# Patient Record
Sex: Male | Born: 1965 | Race: White | Hispanic: No | Marital: Single | Smoking: Never smoker
Health system: Southern US, Community
[De-identification: ages and names within clinical notes are randomized; demographics above are authoritative.]

## PROBLEM LIST (undated history)

## (undated) DIAGNOSIS — E785 Hyperlipidemia, unspecified: Secondary | ICD-10-CM

## (undated) DIAGNOSIS — G4700 Insomnia, unspecified: Secondary | ICD-10-CM

## (undated) DIAGNOSIS — B009 Herpesviral infection, unspecified: Secondary | ICD-10-CM

## (undated) DIAGNOSIS — R002 Palpitations: Secondary | ICD-10-CM

## (undated) DIAGNOSIS — I10 Essential (primary) hypertension: Secondary | ICD-10-CM

## (undated) DIAGNOSIS — G4730 Sleep apnea, unspecified: Secondary | ICD-10-CM

## (undated) HISTORY — DX: Sleep apnea, unspecified: G47.30

## (undated) HISTORY — DX: Hyperlipidemia, unspecified: E78.5

## (undated) HISTORY — DX: Palpitations: R00.2

## (undated) HISTORY — PX: NOSE SURGERY: SHX723

## (undated) HISTORY — DX: Essential (primary) hypertension: I10

## (undated) HISTORY — DX: Herpesviral infection, unspecified: B00.9

## (undated) HISTORY — DX: Insomnia, unspecified: G47.00

---

## 1997-12-29 ENCOUNTER — Ambulatory Visit: Admission: RE | Admit: 1997-12-29 | Discharge: 1997-12-29 | Payer: Self-pay | Admitting: Pulmonary Disease

## 1998-03-01 ENCOUNTER — Ambulatory Visit (HOSPITAL_BASED_OUTPATIENT_CLINIC_OR_DEPARTMENT_OTHER): Admission: RE | Admit: 1998-03-01 | Discharge: 1998-03-01 | Payer: Self-pay | Admitting: Otolaryngology

## 1998-06-14 ENCOUNTER — Ambulatory Visit: Admission: RE | Admit: 1998-06-14 | Discharge: 1998-06-14 | Payer: Self-pay | Admitting: Otolaryngology

## 1998-08-23 ENCOUNTER — Ambulatory Visit: Admission: RE | Admit: 1998-08-23 | Discharge: 1998-08-23 | Payer: Self-pay | Admitting: Otolaryngology

## 2006-02-05 ENCOUNTER — Ambulatory Visit (HOSPITAL_BASED_OUTPATIENT_CLINIC_OR_DEPARTMENT_OTHER): Admission: RE | Admit: 2006-02-05 | Discharge: 2006-02-05 | Payer: Self-pay | Admitting: Otolaryngology

## 2006-02-14 ENCOUNTER — Ambulatory Visit: Payer: Self-pay | Admitting: Internal Medicine

## 2010-04-14 ENCOUNTER — Encounter
Admission: RE | Admit: 2010-04-14 | Discharge: 2010-04-14 | Payer: Self-pay | Source: Home / Self Care | Attending: Family Medicine | Admitting: Family Medicine

## 2010-08-15 NOTE — Procedures (Signed)
Donald Galloway, MICALE NO.:  [id number]   MEDICAL RECORD NO.:  [id number]          PATIENT TYPE:  OUT   LOCATION:  SLEEP CENTER                 FACILITY:  [facility]   PHYSICIAN:  Clinton D. Maple Hudson, MD, FCCP, FACPDATE OF BIRTH:  April 29, 1964   DATE OF STUDY:  02/05/2006                              NOCTURNAL POLYSOMNOGRAM   INDICATION FOR STUDY:  Hypersomnia with sleep apnea.   EPWORTH SLEEPINESS SCORE:  1/25.   BMI:  25.   WEIGHT:  162 pounds.   HOME MEDICATION:  HCTZ, Zestril, Norvasc.   MEDICATIONS:  Patient has a history of obstructive sleep apnea with unsatisfactory  control.  A diagnostic study in May of 2000 gave an AHI of 24 per hour.  He  has been wearing CPAP, but generally removing it during the night.  He has  had previous nasal surgery.  Re-evaluation with split protocol was  requested.   SLEEP ARCHITECTURE:  Total sleep time 371 minutes with sleep efficiency 88%.  Stage 1 was 4%, stage 2 64%, stages 3 and 4 13%, REM 19% of total sleep  time.  Sleep latency 24 minutes, REM latency 178 minutes, awake after sleep  onset 33 minutes, arousal index 7.4.  No bedtime medication was reported.   RESPIRATORY DATA:  Split study protocol.  Apnea/hypopnea index (AHI, RDI)  27.7 obstructive events per hour, indicating moderate obstructive sleep  apnea/hypopnea syndrome before CPAP.  There were 9 obstructive apneas and 54  hypopneas before CPAP.  All sleep and events were while supine.  REM AHI 0  per hour.  CPAP was titrated to 9 CWP, AHI 0 per hour.  His own medium  ResMed Swift was used with heated humidifier.   OXYGEN DATA:  Moderate to occasionally severe snoring with oxygen  desaturation to a nadir of 85%.  After CPAP control saturation held 96% on  room air.   CARDIAC DATA:  Normal sinus rhythm.   MOVEMENT-PARASOMNIA:  Occasional limb jerk, insignificant.   IMPRESSIONS-RECOMMENDATIONS:  1. Moderate obstructive sleep apnea/hypopnea syndrome,  apnea/hypopnea      index 27.7 per hour, with all sleep and events while supine.  Moderate      to loud snoring with oxygen desaturation to a nadir of 85%.  2. Successful continuous positive airway pressure titration to 9 CWP,      apnea/hypopnea index 0 per hour.  His own [employer] with medium      nasal pillows was used with a heated humidifier.      Clinton D. Maple Hudson, MD, FCCP, FACP  Diplomate, Biomedical engineer of Sleep Medicine  Electronically Signed     CDY/MEDQ  D:  02/14/2006 09:50:16  T:  02/14/2006 13:52:37  Job:  [id number]

## 2012-07-07 ENCOUNTER — Ambulatory Visit
Admission: RE | Admit: 2012-07-07 | Discharge: 2012-07-07 | Disposition: A | Discharge status: Home / Self Care | Payer: 59 | Source: Ambulatory Visit | Attending: Emergency Medicine | Admitting: Emergency Medicine

## 2012-07-07 ENCOUNTER — Other Ambulatory Visit: Payer: Self-pay | Admitting: Emergency Medicine

## 2012-07-07 DIAGNOSIS — R52 Pain, unspecified: Secondary | ICD-10-CM

## 2012-07-19 ENCOUNTER — Other Ambulatory Visit: Payer: Self-pay | Admitting: Emergency Medicine

## 2012-07-19 DIAGNOSIS — G8929 Other chronic pain: Secondary | ICD-10-CM

## 2012-07-22 ENCOUNTER — Ambulatory Visit
Admission: RE | Admit: 2012-07-22 | Discharge: 2012-07-22 | Disposition: A | Discharge status: Home / Self Care | Payer: 59 | Source: Ambulatory Visit | Attending: Emergency Medicine | Admitting: Emergency Medicine

## 2012-07-22 DIAGNOSIS — G8929 Other chronic pain: Secondary | ICD-10-CM

## 2016-04-09 DIAGNOSIS — G4733 Obstructive sleep apnea (adult) (pediatric): Secondary | ICD-10-CM | POA: Diagnosis not present

## 2016-04-13 DIAGNOSIS — K648 Other hemorrhoids: Secondary | ICD-10-CM | POA: Diagnosis not present

## 2016-04-13 DIAGNOSIS — Z1211 Encounter for screening for malignant neoplasm of colon: Secondary | ICD-10-CM | POA: Diagnosis not present

## 2017-01-29 DIAGNOSIS — R7301 Impaired fasting glucose: Secondary | ICD-10-CM | POA: Diagnosis not present

## 2017-01-29 DIAGNOSIS — Z125 Encounter for screening for malignant neoplasm of prostate: Secondary | ICD-10-CM | POA: Diagnosis not present

## 2017-01-29 DIAGNOSIS — I10 Essential (primary) hypertension: Secondary | ICD-10-CM | POA: Diagnosis not present

## 2017-01-29 DIAGNOSIS — E7800 Pure hypercholesterolemia, unspecified: Secondary | ICD-10-CM | POA: Diagnosis not present

## 2017-02-05 DIAGNOSIS — Z125 Encounter for screening for malignant neoplasm of prostate: Secondary | ICD-10-CM | POA: Diagnosis not present

## 2017-02-05 DIAGNOSIS — I10 Essential (primary) hypertension: Secondary | ICD-10-CM | POA: Diagnosis not present

## 2017-02-12 DIAGNOSIS — G4733 Obstructive sleep apnea (adult) (pediatric): Secondary | ICD-10-CM | POA: Diagnosis not present

## 2018-02-02 DIAGNOSIS — E7800 Pure hypercholesterolemia, unspecified: Secondary | ICD-10-CM | POA: Diagnosis not present

## 2018-02-02 DIAGNOSIS — Z0000 Encounter for general adult medical examination without abnormal findings: Secondary | ICD-10-CM | POA: Diagnosis not present

## 2018-02-02 DIAGNOSIS — Z125 Encounter for screening for malignant neoplasm of prostate: Secondary | ICD-10-CM | POA: Diagnosis not present

## 2018-02-09 DIAGNOSIS — Z0000 Encounter for general adult medical examination without abnormal findings: Secondary | ICD-10-CM | POA: Diagnosis not present

## 2020-01-30 ENCOUNTER — Other Ambulatory Visit: Payer: Self-pay | Admitting: Family Medicine

## 2020-01-30 DIAGNOSIS — R1013 Epigastric pain: Secondary | ICD-10-CM

## 2020-02-09 ENCOUNTER — Other Ambulatory Visit: Payer: Self-pay

## 2020-02-09 ENCOUNTER — Ambulatory Visit (INDEPENDENT_AMBULATORY_CARE_PROVIDER_SITE_OTHER): Payer: 59

## 2020-02-09 DIAGNOSIS — R1013 Epigastric pain: Secondary | ICD-10-CM

## 2021-03-02 IMAGING — US US ABDOMEN LIMITED
1 series · 14 of 25 positions shown · non-contrast
Comparison: None

Correlation: CT abdomen and pelvis 04/14/2010

CLINICAL DATA: Constant epigastric pain for several months, history
hypertension, hyperlipidemia

EXAM:
ULTRASOUND ABDOMEN LIMITED RIGHT UPPER QUADRANT

[Series 1: us abdomen limited · 0.14mm/px · 14 of 55 slices shown]
[im 1/55]
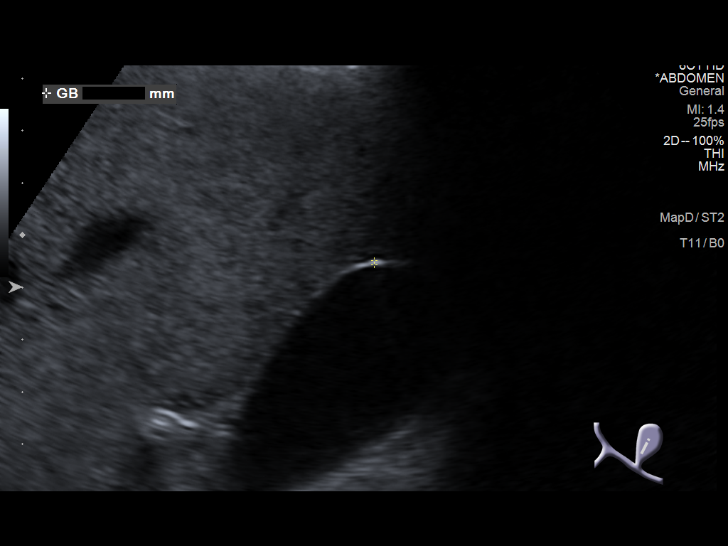
[im 5/55]
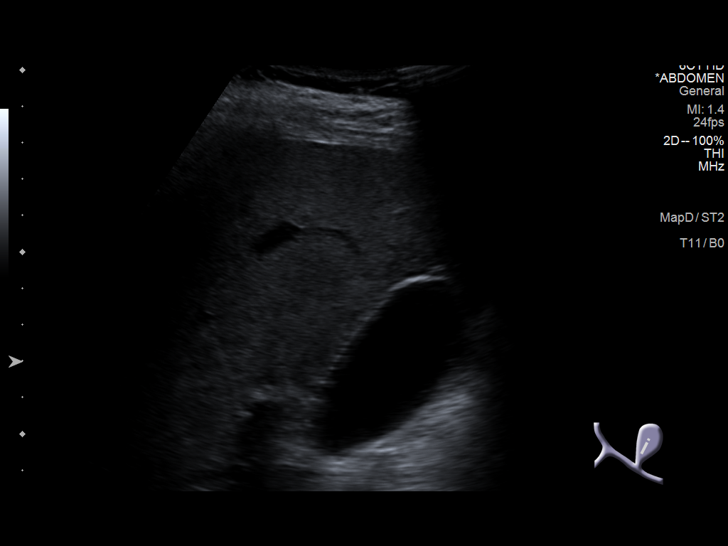
[im 10/55]
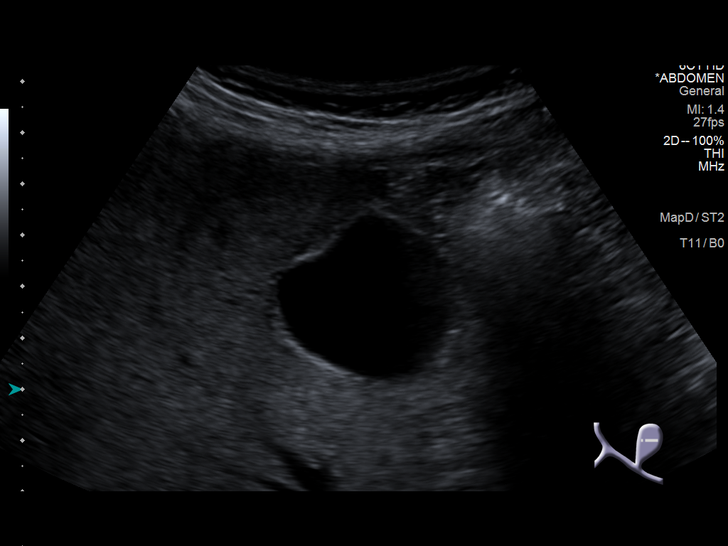
[im 14/55]
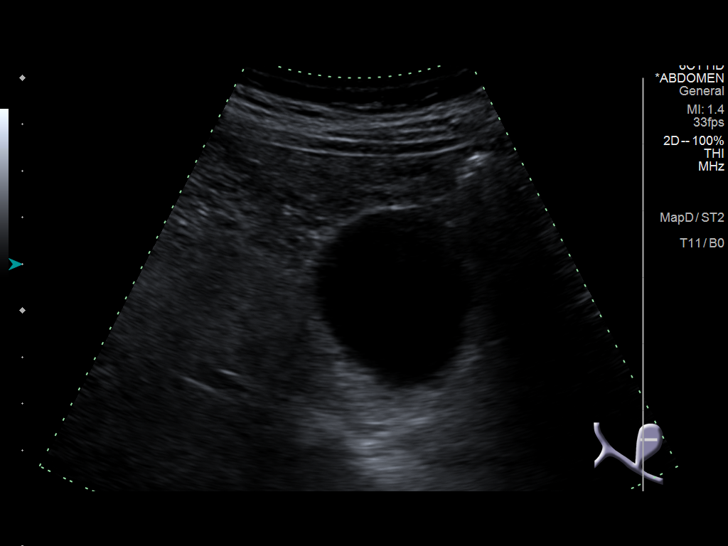
[im 19/55]
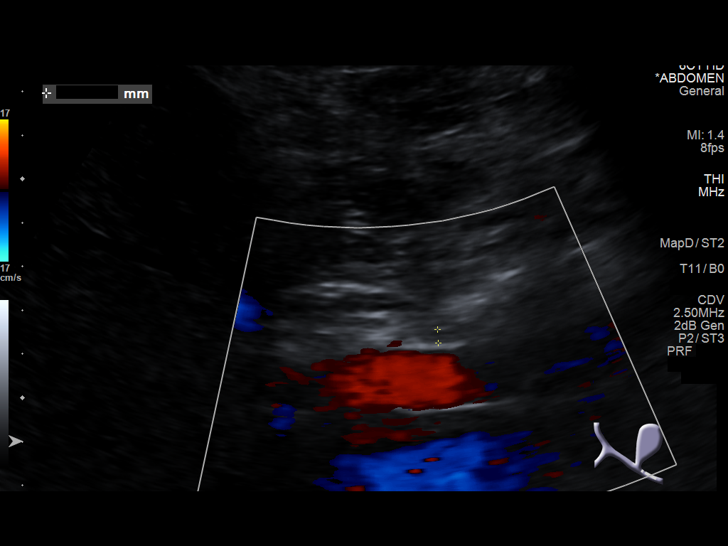
[im 21/55]
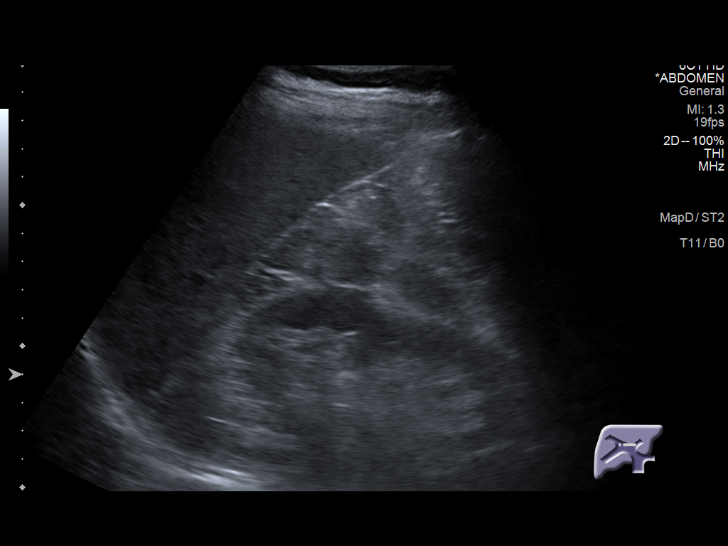
[im 25/55]
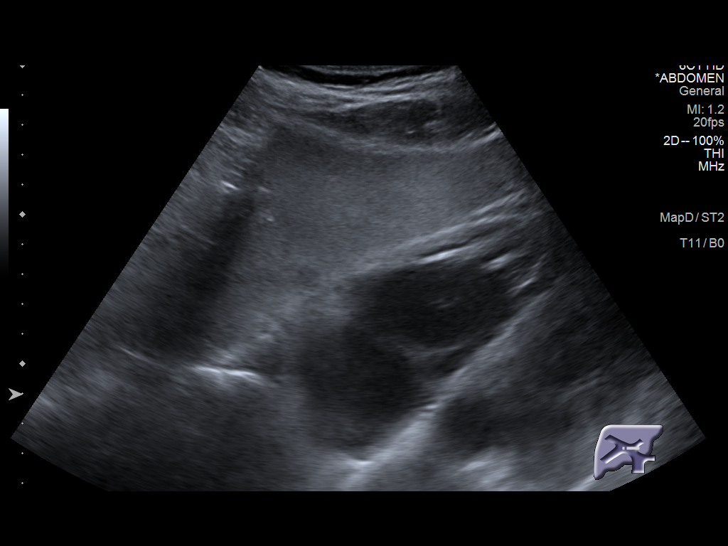
[im 30/55]
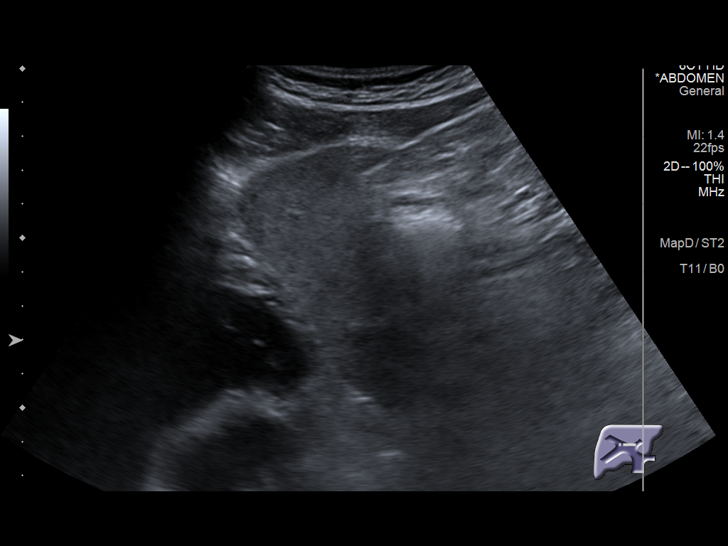
[im 34/55]
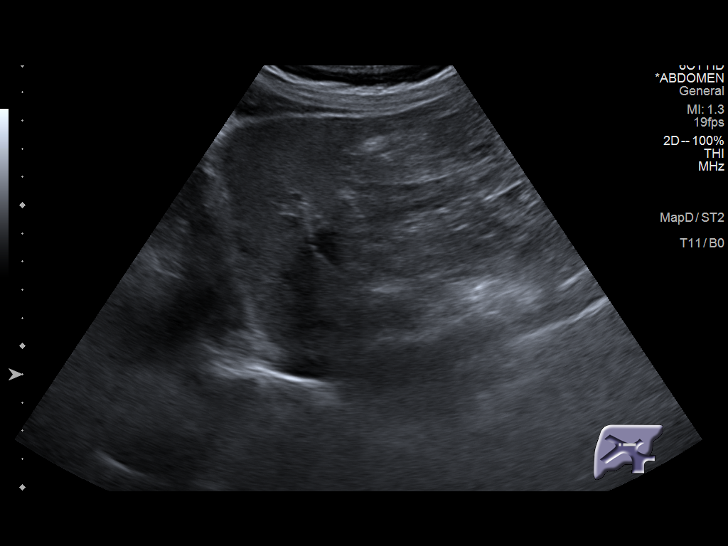
[im 37/55]
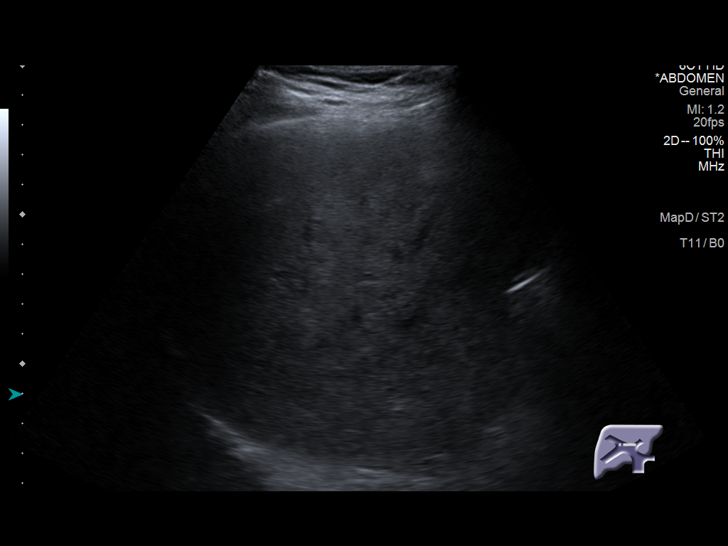
[im 41/55]
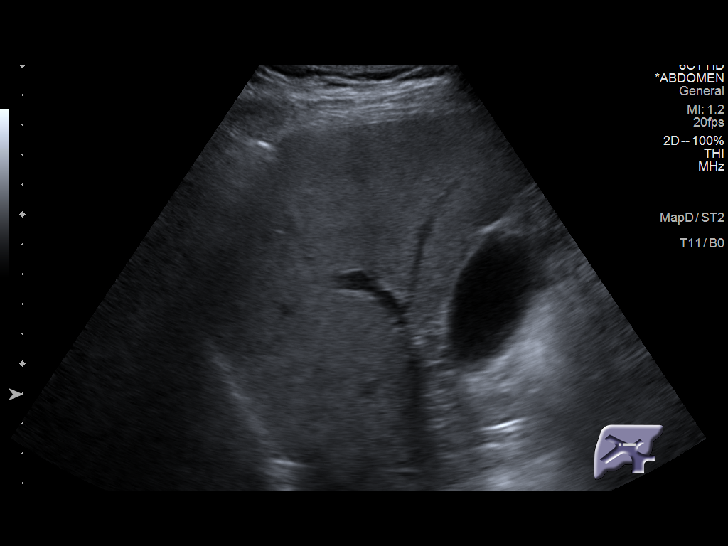
[im 46/55]
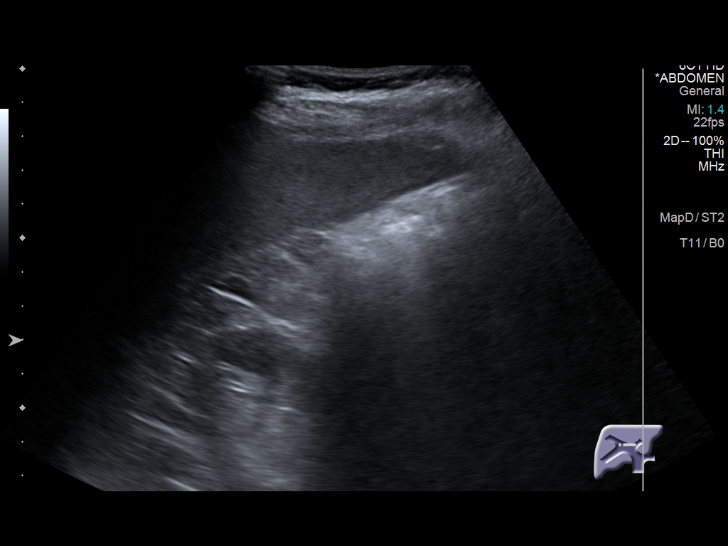
[im 50/55]
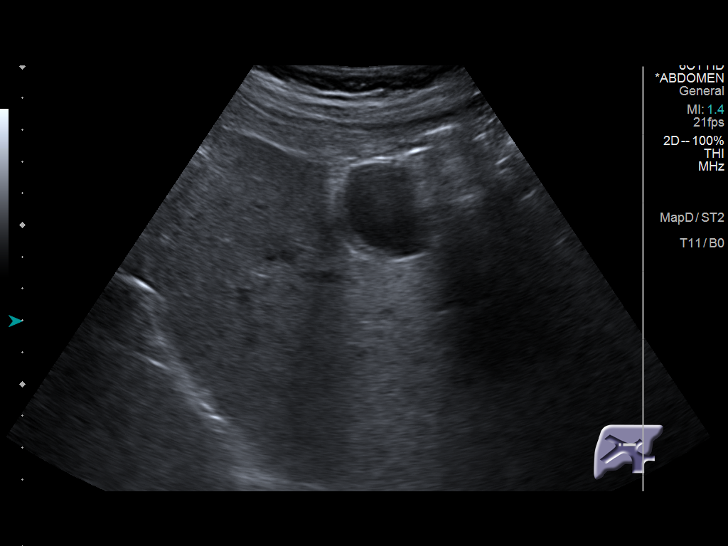
[im 55/55]
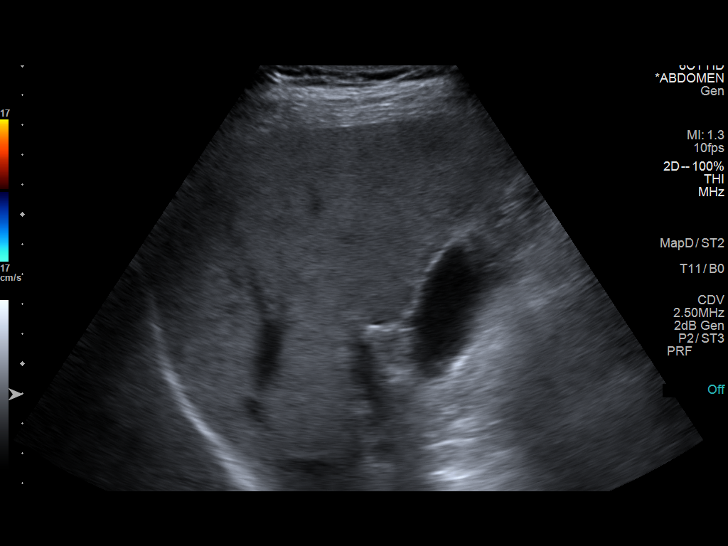

[14 of 25 positions shown; findings below may reference images not displayed]

FINDINGS: Gallbladder:

Normally distended without stones or wall thickening. No
pericholecystic fluid or sonographic Murphy sign.

Common bile duct:

Diameter: Suboptimally visualized due to bowel at the porta hepatis.
No intrahepatic biliary dilatation seen.

Liver:

Echogenic parenchyma, likely fatty infiltration though this can be
seen with cirrhosis and certain infiltrative disorders. No focal
hepatic mass or nodularity. Portal vein is patent on color Doppler
imaging with normal direction of blood flow towards the liver.

Other: No RIGHT upper quadrant free fluid.
IMPRESSION: Probable fatty infiltration of liver as above.

Inadequately visualized CBD; no intrahepatic biliary dilatation
identified.

Otherwise negative exam.

## 2022-07-16 ENCOUNTER — Encounter (HOSPITAL_COMMUNITY): Payer: Self-pay | Admitting: Emergency Medicine

## 2022-07-16 ENCOUNTER — Inpatient Hospital Stay (HOSPITAL_COMMUNITY)
Admission: EM | Admit: 2022-07-16 | Discharge: 2022-07-24 | DRG: 025 | Disposition: A | Discharge status: Home / Self Care | Payer: 59 | Attending: Neurology | Admitting: Neurology

## 2022-07-16 ENCOUNTER — Inpatient Hospital Stay (HOSPITAL_COMMUNITY): Payer: 59

## 2022-07-16 ENCOUNTER — Emergency Department (HOSPITAL_COMMUNITY): Payer: 59

## 2022-07-16 ENCOUNTER — Other Ambulatory Visit: Payer: Self-pay

## 2022-07-16 DIAGNOSIS — I608 Other nontraumatic subarachnoid hemorrhage: Secondary | ICD-10-CM | POA: Diagnosis present

## 2022-07-16 DIAGNOSIS — I959 Hypotension, unspecified: Secondary | ICD-10-CM | POA: Diagnosis not present

## 2022-07-16 DIAGNOSIS — R1013 Epigastric pain: Secondary | ICD-10-CM | POA: Diagnosis not present

## 2022-07-16 DIAGNOSIS — Z79899 Other long term (current) drug therapy: Secondary | ICD-10-CM | POA: Diagnosis not present

## 2022-07-16 DIAGNOSIS — G459 Transient cerebral ischemic attack, unspecified: Secondary | ICD-10-CM | POA: Insufficient documentation

## 2022-07-16 DIAGNOSIS — I636 Cerebral infarction due to cerebral venous thrombosis, nonpyogenic: Secondary | ICD-10-CM | POA: Diagnosis present

## 2022-07-16 DIAGNOSIS — I10 Essential (primary) hypertension: Secondary | ICD-10-CM | POA: Diagnosis present

## 2022-07-16 DIAGNOSIS — G4733 Obstructive sleep apnea (adult) (pediatric): Secondary | ICD-10-CM | POA: Diagnosis present

## 2022-07-16 DIAGNOSIS — Z8249 Family history of ischemic heart disease and other diseases of the circulatory system: Secondary | ICD-10-CM

## 2022-07-16 DIAGNOSIS — E785 Hyperlipidemia, unspecified: Secondary | ICD-10-CM | POA: Diagnosis present

## 2022-07-16 DIAGNOSIS — Q282 Arteriovenous malformation of cerebral vessels: Secondary | ICD-10-CM

## 2022-07-16 DIAGNOSIS — Z83438 Family history of other disorder of lipoprotein metabolism and other lipidemia: Secondary | ICD-10-CM | POA: Diagnosis not present

## 2022-07-16 DIAGNOSIS — R569 Unspecified convulsions: Secondary | ICD-10-CM | POA: Diagnosis not present

## 2022-07-16 DIAGNOSIS — E876 Hypokalemia: Secondary | ICD-10-CM | POA: Diagnosis not present

## 2022-07-16 DIAGNOSIS — I770 Arteriovenous fistula, acquired: Secondary | ICD-10-CM | POA: Diagnosis present

## 2022-07-16 DIAGNOSIS — D6859 Other primary thrombophilia: Secondary | ICD-10-CM | POA: Diagnosis not present

## 2022-07-16 DIAGNOSIS — I609 Nontraumatic subarachnoid hemorrhage, unspecified: Secondary | ICD-10-CM | POA: Diagnosis not present

## 2022-07-16 DIAGNOSIS — R4701 Aphasia: Secondary | ICD-10-CM | POA: Diagnosis present

## 2022-07-16 DIAGNOSIS — G08 Intracranial and intraspinal phlebitis and thrombophlebitis: Secondary | ICD-10-CM | POA: Diagnosis not present

## 2022-07-16 DIAGNOSIS — Z888 Allergy status to other drugs, medicaments and biological substances status: Secondary | ICD-10-CM | POA: Diagnosis not present

## 2022-07-16 DIAGNOSIS — I676 Nonpyogenic thrombosis of intracranial venous system: Principal | ICD-10-CM | POA: Diagnosis present

## 2022-07-16 DIAGNOSIS — R4182 Altered mental status, unspecified: Secondary | ICD-10-CM | POA: Diagnosis present

## 2022-07-16 DIAGNOSIS — I671 Cerebral aneurysm, nonruptured: Secondary | ICD-10-CM

## 2022-07-16 LAB — CBC
HCT: 43.4 % (ref 39.0–52.0)
Hemoglobin: 15.2 g/dL (ref 13.0–17.0)
MCH: 30.2 pg (ref 26.0–34.0)
MCHC: 35 g/dL (ref 30.0–36.0)
MCV: 86.1 fL (ref 80.0–100.0)
Platelets: 281 10*3/uL (ref 150–400)
RBC: 5.04 MIL/uL (ref 4.22–5.81)
RDW: 11.6 % (ref 11.5–15.5)
WBC: 5.9 10*3/uL (ref 4.0–10.5)
nRBC: 0 % (ref 0.0–0.2)

## 2022-07-16 LAB — COMPREHENSIVE METABOLIC PANEL
ALT: 37 U/L (ref 0–44)
AST: 26 U/L (ref 15–41)
Albumin: 4.3 g/dL (ref 3.5–5.0)
Alkaline Phosphatase: 48 U/L (ref 38–126)
Anion gap: 10 (ref 5–15)
BUN: 15 mg/dL (ref 6–20)
CO2: 26 mmol/L (ref 22–32)
Calcium: 9.6 mg/dL (ref 8.9–10.3)
Chloride: 101 mmol/L (ref 98–111)
Creatinine, Ser: 1.07 mg/dL (ref 0.61–1.24)
GFR, Estimated: >60 mL/min (ref >60)
Glucose, Bld: 108 mg/dL — ABNORMAL HIGH (ref 70–99)
Potassium: 4 mmol/L (ref 3.5–5.1)
Sodium: 137 mmol/L (ref 135–145)
Total Bilirubin: 1 mg/dL (ref 0.3–1.2)
Total Protein: 7.1 g/dL (ref 6.5–8.1)

## 2022-07-16 LAB — I-STAT CHEM 8, ED
BUN: 18 mg/dL (ref 6–20)
Calcium, Ion: 1.21 mmol/L (ref 1.15–1.40)
Chloride: 101 mmol/L (ref 98–111)
Creatinine, Ser: 1 mg/dL (ref 0.61–1.24)
Glucose, Bld: 105 mg/dL — ABNORMAL HIGH (ref 70–99)
HCT: 44 % (ref 39.0–52.0)
Hemoglobin: 15 g/dL (ref 13.0–17.0)
Potassium: 4.1 mmol/L (ref 3.5–5.1)
Sodium: 138 mmol/L (ref 135–145)
TCO2: 26 mmol/L (ref 22–32)

## 2022-07-16 LAB — HEMOGLOBIN A1C
Hgb A1c MFr Bld: 5.2 % (ref 4.8–5.6)
Mean Plasma Glucose: 102.54 mg/dL

## 2022-07-16 LAB — ETHANOL: Alcohol, Ethyl (B): <10 mg/dL (ref <10)

## 2022-07-16 LAB — DIFFERENTIAL
Abs Immature Granulocytes: 0.02 10*3/uL (ref 0.00–0.07)
Basophils Absolute: 0 10*3/uL (ref 0.0–0.1)
Basophils Relative: 1 %
Eosinophils Absolute: 0.1 10*3/uL (ref 0.0–0.5)
Eosinophils Relative: 2 %
Immature Granulocytes: 0 %
Lymphocytes Relative: 30 %
Lymphs Abs: 1.8 10*3/uL (ref 0.7–4.0)
Monocytes Absolute: 0.5 10*3/uL (ref 0.1–1.0)
Monocytes Relative: 8 %
Neutro Abs: 3.4 10*3/uL (ref 1.7–7.7)
Neutrophils Relative %: 59 %

## 2022-07-16 LAB — HEPARIN LEVEL (UNFRACTIONATED): Heparin Unfractionated: 0.24 IU/mL — ABNORMAL LOW (ref 0.30–0.70)

## 2022-07-16 LAB — HIV ANTIBODY (ROUTINE TESTING W REFLEX): HIV Screen 4th Generation wRfx: NONREACTIVE

## 2022-07-16 LAB — PROTIME-INR
INR: 1 (ref 0.8–1.2)
Prothrombin Time: 12.9 seconds (ref 11.4–15.2)

## 2022-07-16 LAB — APTT: aPTT: 27 seconds (ref 24–36)

## 2022-07-16 MED ORDER — IOHEXOL 350 MG/ML SOLN
75.0000 mL | Freq: Once | INTRAVENOUS | Status: AC | PRN
  Administered 2022-07-16: 75 mL via INTRAVENOUS

## 2022-07-16 MED ORDER — ROSUVASTATIN CALCIUM 5 MG PO TABS
5.0000 mg | ORAL_TABLET | Freq: Every day | ORAL | Status: DC
  Administered 2022-07-17: 5 mg via ORAL
  Filled 2022-07-16: qty 1

## 2022-07-16 MED ORDER — TRAZODONE HCL 50 MG PO TABS
50.0000 mg | ORAL_TABLET | Freq: Every day | ORAL | Status: DC
  Administered 2022-07-16: 25 mg via ORAL
  Administered 2022-07-17 – 2022-07-21 (×3): 50 mg via ORAL
  Filled 2022-07-16 (×6): qty 1

## 2022-07-16 MED ORDER — ACETAMINOPHEN 325 MG PO TABS
650.0000 mg | ORAL_TABLET | ORAL | Status: DC | PRN
  Administered 2022-07-17: 650 mg via ORAL
  Filled 2022-07-16 (×2): qty 2

## 2022-07-16 MED ORDER — LORATADINE 10 MG PO TABS: 10.0000 mg | ORAL_TABLET | Freq: Every day | ORAL | Status: DC | PRN

## 2022-07-16 MED ORDER — HEPARIN (PORCINE) 25000 UT/250ML-% IV SOLN
1000.0000 [IU]/h | INTRAVENOUS | Status: DC
  Administered 2022-07-17: 1100 [IU]/h via INTRAVENOUS
  Administered 2022-07-18 – 2022-07-20 (×3): 1000 [IU]/h via INTRAVENOUS
  Filled 2022-07-16 (×3): qty 250

## 2022-07-16 MED ORDER — ACETAMINOPHEN 160 MG/5ML PO SOLN: 650.0000 mg | ORAL | Status: DC | PRN

## 2022-07-16 MED ORDER — LISINOPRIL 20 MG PO TABS
20.0000 mg | ORAL_TABLET | Freq: Every day | ORAL | Status: DC
  Administered 2022-07-17 – 2022-07-22 (×6): 20 mg via ORAL
  Filled 2022-07-16 (×6): qty 1

## 2022-07-16 MED ORDER — STROKE: EARLY STAGES OF RECOVERY BOOK
Freq: Once | Status: AC
  Filled 2022-07-16: qty 1

## 2022-07-16 MED ORDER — AMLODIPINE BESYLATE 5 MG PO TABS
5.0000 mg | ORAL_TABLET | Freq: Every day | ORAL | Status: DC
  Administered 2022-07-16 – 2022-07-22 (×7): 5 mg via ORAL
  Filled 2022-07-16 (×7): qty 1

## 2022-07-16 MED ORDER — HYDRALAZINE HCL 20 MG/ML IJ SOLN: 5.0000 mg | Freq: Four times a day (QID) | INTRAMUSCULAR | Status: DC | PRN

## 2022-07-16 MED ORDER — SODIUM CHLORIDE 0.9% FLUSH: 3.0000 mL | Freq: Once | INTRAVENOUS | Status: DC

## 2022-07-16 MED ORDER — HEPARIN (PORCINE) 25000 UT/250ML-% IV SOLN
1100.0000 [IU]/h | INTRAVENOUS | Status: DC
  Administered 2022-07-16: 1100 [IU]/h via INTRAVENOUS
  Filled 2022-07-16: qty 250

## 2022-07-16 MED ORDER — ACETAMINOPHEN 650 MG RE SUPP: 650.0000 mg | RECTAL | Status: DC | PRN

## 2022-07-16 NOTE — Plan of Care (Signed)
  Problem: Education: Goal: Knowledge of disease or condition will improve Outcome: Progressing Goal: Knowledge of secondary prevention will improve (MUST DOCUMENT ALL) Outcome: Progressing Goal: Knowledge of patient specific risk factors will improve (Mark N/A or DELETE if not current risk factor) Outcome: Progressing   Problem: Ischemic Stroke/TIA Tissue Perfusion: Goal: Complications of ischemic stroke/TIA will be minimized Outcome: Progressing   Problem: Coping: Goal: Will verbalize positive feelings about self Outcome: Progressing Goal: Will identify appropriate support needs Outcome: Progressing   

## 2022-07-16 NOTE — H&P (Signed)
History and Physical    Donald Galloway ZOX:[id number] DOB: Apr 23, 1965 DOA: 07/16/2022  PCP: Pcp, No (Confirm with patient/family/NH records and if not entered, this has to be entered at [facility] point of entry) Patient coming from: Home  I have personally briefly reviewed patient's old medical records in [facility]  Chief Complaint: Slurred and confusion.  HPI: Donald Galloway is a 56 y.o. male with medical history significant of HTN, HLD, seasonal allergies, presented with transient slurred speech and confusion  Yesterday, patient woke up with mild headache on the right forehead for which he took some OTC Tylenol and headache went away.  Symptom happened yesterday afternoon while at work, patient developed sudden onset of confusion and slurred speech.  At the moment he had trouble to understand his colleagues words and meantime he also had trouble to talk himself.  Symptoms lasted about 2 to 3 hours and resolved.  Denies any weakness Of his limbs, no double vision no hearing problems. ED Course: None bradycardia none hypotensive.  CT head showed hyperdense appearance of the left sigmoid/transverse sinus junction concerning for dural venous sinus thrombosis.  CTA showed filling defect in the left transverse sinus/sigmoid sinus and left jugular bulb concerning for thrombosis.  MRI of the brain showed abnormal signals in the left transverse and sigmoid sinus and left jugular bulb compatible with dural venous sinus thrombosis and small focus of circular FLAIR hyperintensity compatible with small volume acute SAH and recommend MRA to rule out dural AV fistula or AVM formation.  Review of Systems: As per HPI otherwise 14 point review of systems negative.    Past Medical History:  Diagnosis Date   Herpes simplex    Hyperlipidemia    Hypertension    Insomnia    Palpitations    evaluated by DR Eldridge Dace   Sleep apnea     Past Surgical History:  Procedure Laterality Date   NOSE SURGERY     DR  SHOEMAKER     reports that he has never smoked. He does not have any smokeless tobacco history on file. He reports current alcohol use. He reports that he does not use drugs.  Allergies  Allergen Reactions   Zolpidem Tartrate     Leaves him groggy in the AM    Family History  Problem Relation Age of Onset   Hyperlipidemia Mother    Hyperlipidemia Father    Hypertension Father      Prior to Admission medications   Medication Sig Start Date End Date Taking? Authorizing Provider  lisinopril (PRINIVIL,ZESTRIL) 40 MG tablet Take 20 mg by mouth daily. Pt stated he takes 1/2 tablet everyday since a year ago.   Yes [provider]  loratadine (CLARITIN) 10 MG tablet Take 10 mg by mouth daily as needed for allergies.   Yes [provider]  rosuvastatin (CRESTOR) 5 MG tablet Take 5 mg by mouth daily.   Yes [provider]  traZODone (DESYREL) 100 MG tablet Take 50 mg by mouth at bedtime. Pt stated he took 1/2 tablet the night of 07/15/22   Yes [provider]  amLODipine (NORVASC) 10 MG tablet Take 10 mg by mouth daily. Patient not taking: Reported on 07/16/2022    [provider]  aspirin 81 MG tablet Take 81 mg by mouth daily. Patient not taking: Reported on 07/16/2022    [provider]  valACYclovir (VALTREX) 1000 MG tablet Take 1,000 mg by mouth 2 (two) times daily. Patient not taking: Reported on  07/16/2022    [provider]    Physical Exam: Vitals:   07/16/22 1149 07/16/22 1200 07/16/22 1245 07/16/22 1600  BP: (!) 142/82 131/81 129/83 (!) 160/90  Pulse: 93 89 91 94  Resp: 15 16 20 18   Temp: 98.7 F (37.1 C) 98 F (36.7 C)  98.1 F (36.7 C)  TempSrc: Oral Oral    SpO2: 100% 100% 100% 100%  Weight:      Height:        Constitutional: NAD, calm, comfortable Vitals:   07/16/22 1149 07/16/22 1200 07/16/22 1245 07/16/22 1600  BP: (!) 142/82 131/81 129/83 (!) 160/90  Pulse: 93 89 91 94  Resp: 15 16 20 18   Temp:  98.7 F (37.1 C) 98 F (36.7 C)  98.1 F (36.7 C)  TempSrc: Oral Oral    SpO2: 100% 100% 100% 100%  Weight:      Height:       Eyes: PERRL, lids and conjunctivae normal ENMT: Mucous membranes are moist. Posterior pharynx clear of any exudate or lesions.Normal dentition.  Neck: normal, supple, no masses, no thyromegaly Respiratory: clear to auscultation bilaterally, no wheezing, no crackles. Normal respiratory effort. No accessory muscle use.  Cardiovascular: Regular rate and rhythm, no murmurs / rubs / gallops. No extremity edema. 2+ pedal pulses. No carotid bruits.  Abdomen: no tenderness, no masses palpated. No hepatosplenomegaly. Bowel sounds positive.  Musculoskeletal: no clubbing / cyanosis. No joint deformity upper and lower extremities. Good ROM, no contractures. Normal muscle tone.  Skin: no rashes, lesions, ulcers. No induration Neurologic: CN 2-12 grossly intact. Sensation intact, DTR normal. Strength 5/5 in all 4.  Psychiatric: Normal judgment and insight. Alert and oriented x 3. Normal mood.     Labs on Admission: I have personally reviewed following labs and imaging studies  CBC: Recent Labs  Lab 07/16/22 1017 07/16/22 1034  WBC 5.9  --   NEUTROABS 3.4  --   HGB 15.2 15.0  HCT 43.4 44.0  MCV 86.1  --   PLT 281  --    Basic Metabolic Panel: Recent Labs  Lab 07/16/22 1017 07/16/22 1034  NA 137 138  K 4.0 4.1  CL 101 101  CO2 26  --   GLUCOSE 108* 105*  BUN 15 18  CREATININE 1.07 1.00  CALCIUM 9.6  --    GFR: Estimated Creatinine Clearance: 82.9 mL/min (by C-G formula based on SCr of 1 mg/dL). Liver Function Tests: Recent Labs  Lab 07/16/22 1017  AST 26  ALT 37  ALKPHOS 48  BILITOT 1.0  PROT 7.1  ALBUMIN 4.3   No results for input(s): "LIPASE", "AMYLASE" in the last 168 hours. No results for input(s): "AMMONIA" in the last 168 hours. Coagulation Profile: Recent Labs  Lab 07/16/22 1017  INR 1.0   Cardiac Enzymes: No results for  input(s): "CKTOTAL", "CKMB", "CKMBINDEX", "TROPONINI" in the last 168 hours. BNP (last 3 results) No results for input(s): "PROBNP" in the last 8760 hours. HbA1C: No results for input(s): "HGBA1C" in the last 72 hours. CBG: No results for input(s): "GLUCAP" in the last 168 hours. Lipid Profile: No results for input(s): "CHOL", "HDL", "LDLCALC", "TRIG", "CHOLHDL", "LDLDIRECT" in the last 72 hours. Thyroid Function Tests: No results for input(s): "TSH", "T4TOTAL", "FREET4", "T3FREE", "THYROIDAB" in the last 72 hours. Anemia Panel: No results for input(s): "VITAMINB12", "FOLATE", "FERRITIN", "TIBC", "IRON", "RETICCTPCT" in the last 72 hours. Urine analysis: No results found for: "COLORURINE", "APPEARANCEUR", "LABSPEC", "PHURINE", "GLUCOSEU", "HGBUR", "BILIRUBINUR", "KETONESUR", "PROTEINUR", "UROBILINOGEN", "  NITRITE", "LEUKOCYTESUR"  Radiological Exams on Admission: MR BRAIN WO CONTRAST  Result Date: 07/16/2022 CLINICAL DATA:  Transient ischemic attack (TIA) EXAM: MRI HEAD WITHOUT CONTRAST TECHNIQUE: Multiplanar, multiecho pulse sequences of the brain and surrounding structures were obtained without intravenous contrast. COMPARISON:  None Available. FINDINGS: Brain: No acute infarction, hydrocephalus, extra-axial collection or mass lesion. Small focus of sulcal FLAIR hyperintensity in the posterior aspect of the left sylvian fissure is compatible with small volume acute subarachnoid hemorrhage. On susceptibility weighted imaging prominent serpiginous vessels in the left cerebellum, left parieto-occipital and posterior temporal regions. Vascular: Abnormal signal in the left transverse and sigmoid sinuses and left jugular bulb. Major arterial flow voids are maintained at the skull base. Skull and upper cervical spine: Normal marrow signal. Sinuses/Orbits: Negative. IMPRESSION: 1. Abnormal signal in the left transverse and sigmoid sinuses and left jugular bulb, compatible with dural venous sinus  thrombosis seen on same day CT venogram. No evidence of adjacent acute infarct. 2. Small focus of sulcal FLAIR hyperintensity in the posterior aspect of the left sylvian fissure is compatible with small volume acute subarachnoid hemorrhage. 3. On susceptibility weighted imaging prominent serpiginous vessels in the left cerebellum, left parieto-occipital and posterior temporal regions (adjacent to the thrombosed sinuses). This probably represents venous congestion due to the dural venous thrombosis described above, but MRA is recommended to exclude dural AV fistula or AVM as a cause of the patient's sinus thrombosis. Electronically Signed   By: Feliberto Harts M.D.   On: 07/16/2022 16:09   CT VENOGRAM HEAD  Result Date: 07/16/2022 CLINICAL DATA:  Dural venous sinus thrombosis suspected EXAM: CT VENOGRAM HEAD TECHNIQUE: Venographic phase images of the brain were obtained following the administration of intravenous contrast. Multiplanar reformats and maximum intensity projections were generated. RADIATION DOSE REDUCTION: This exam was performed according to the departmental dose-optimization program which includes automated exposure control, adjustment of the mA and/or kV according to patient size and/or use of iterative reconstruction technique. CONTRAST:  75mL OMNIPAQUE IOHEXOL 350 MG/ML SOLN COMPARISON:  No prior CT venogram available, correlation is made with CT head 07/16/2022 10:40 a.m. FINDINGS: Filling defect in the left transverse sinus (series 3, images 12-13 and series 7, images 169-22), left sigmoid sinus (series 7, images 137-151 and series 3, images 5-9), and in the jugular bulb (series 3, images 2-4 and series 7, images 124-130). Otherwise patent venous sinuses. IMPRESSION: Filling defect in the left transverse sinus, left sigmoid sinus, and left jugular bulb, concerning for thrombosis. These results were called by telephone at the time of interpretation on 07/16/2022 at 2:22 pm to provider [facility]  RAY , who verbally acknowledged these results. Electronically Signed   By: Wiliam Ke M.D.   On: 07/16/2022 14:24   CT HEAD WO CONTRAST  Result Date: 07/16/2022 CLINICAL DATA:  Altered mental status EXAM: CT HEAD WITHOUT CONTRAST TECHNIQUE: Contiguous axial images were obtained from the base of the skull through the vertex without intravenous contrast. RADIATION DOSE REDUCTION: This exam was performed according to the departmental dose-optimization program which includes automated exposure control, adjustment of the mA and/or kV according to patient size and/or use of iterative reconstruction technique. COMPARISON:  None Available. FINDINGS: Brain: No evidence of acute infarction, hydrocephalus, or mass lesion/mass effect. Linear hyperdensity in the left middle cranial fossa is nonspecific, but could represent a small amount of subarachnoid blood products (series 3, image 10). Vascular: Asymmetrically hyperdense appearance of the left sigmoid/transverse sinus junction (series 3, image 13). Skull: Normal. Negative for fracture or  focal lesion. Sinuses/Orbits: No middle ear or mastoid effusion. Frothy secretions in the right sphenoid sinus, which can be seen in the setting of acute sinusitis. Orbits are unremarkable. Other: None. IMPRESSION: 1. Asymmetrically hyperdense appearance of the left sigmoid/transverse sinus junction, concerning for dural venous sinus thrombosis. Recommend further evaluation with a CT venogram. 2. Linear hyperdensity in the left middle cranial fossa is nonspecific, but could represent a small amount of subarachnoid blood products. 3. Frothy secretions in the right sphenoid sinus, which can be seen in the setting of acute sinusitis. Electronically Signed   By: Lorenza Cambridge M.D.   On: 07/16/2022 11:13    EKG: Independently reviewed.  Sinus rhythm, no acute ST changes.  Assessment/Plan Active Problems:   * No active hospital problems. *  (please populate well all problems here in  Problem List. (For example, if patient is on BP meds at home and you resume or decide to hold them, it is a problem that needs to be her. Same for CAD, COPD, HLD and so on)  Acute dural venous sinus thrombosis -Include left transverse and sigmoid sinuses and left jugular bulb, confirmed on CTA and MRI -On heparin drip.  Discussed with on-call neurology given the MRI report of small volume acute SAH of the left sylvian fissure.  As per neurologist evaluation, right now, the risk of venous thrombosis expansion outweighs SAH, neurology recommend continue heparin drip. -No history of thromboembolic event.  Denies any history of SLE or rheumatoid arthritis.  No recent facial infection.  TIA -With both comprehensive and expressive aphasia -Likely related to dural venous sinus thrombosis -On heparin drip, hold off aspirin for now -Check A1c and lipid panel  HTN, uncontrolled -There is a SAH, manage BP control 130/80, continue lisinopril -Add amlodipine  DVT prophylaxis: Heparin drip Code Status: Full code Family Communication: None at bedside Disposition Plan: Patient is sick with dural sinus thrombosis and concurrent SAH, on heparin drip requiring close monitoring for expansion of intracranial bleed, expect more than 2 midnight hospital stay. Consults called: Neurology Admission status: Telemetry admission   Emeline General MD [facility] Pager [phone]  07/16/2022, 5:11 PM

## 2022-07-16 NOTE — Consult Note (Signed)
Neurology Consultation  Reason for Consult: Dural venous sinus thrombosis on CT venogram  Referring Physician: Dr. Dalene Seltzer  CC: Transient confusion and speech disturbance  History is obtained from: Chart review, patient   HPI: Donald Galloway is a 57 y.o. male with a medical history significant for essential hypertension, hyperlipidemia, and sleep apnea who presented to the ED 07/16/22 for evaluation of fluctuating levels of altered mental status with onset on 07/15/2022.  The patient states that while he was at work yesterday, he felt that he was having trouble comprehending others as they were speaking to him and at times, he "zoned out" and did not hear their spoken words at all. At this time, he also had some trouble with his speech output that started as slurred speech and then his speech became gibberish. He went home with improvement in symptoms but noticed that on his way home, he was having trouble recalling the names of his long-time co-workers. Speech deficts have waxed and waned with periods of resolution followed by recrudescence. He also notes that he had a dull, aching, frontal and left posterolateral headache since his symptom onset rated 4/10 in severity that did not improve with ibuprofen yesterday or again today. Today while at work, the patient felt that he was again having trouble comprehending what people were saying to him in conjunction with confusion.  He noted that he was having trouble with word-finding today but was not slurred or speaking gibberish quite as badly as yesterday. He sat down at his computer after the symptoms started to resolve and realized that his typing was also gibberish and did not make sense when he tried to re-read his screen.  He states that the symptoms lasted approximately 2 hours today and much longer yesterday but is unable to estimate a time for the event yesterday.  He states that he was concerned that his blood pressure was high during these events  and was slightly elevated to 150/90 and 140/90 range respectively.  He denies recent illness or known history of clotting disorder. Patient does endorse feeling minimal increased right frontal head pressure with laying flat today.   ROS: A complete ROS was performed and is negative except as noted in the HPI.   Past Medical History:  Diagnosis Date   Herpes simplex    Hyperlipidemia    Hypertension    Insomnia    Palpitations    evaluated by DR Eldridge Dace   Sleep apnea    Past Surgical History:  Procedure Laterality Date   NOSE SURGERY     DR SHOEMAKER   Family History  Problem Relation Age of Onset   Hyperlipidemia Mother    Hyperlipidemia Father    Hypertension Father    Social History:   reports that he has never smoked. He does not have any smokeless tobacco history on file. He reports current alcohol use. He reports that he does not use drugs.  Medications  Current Facility-Administered Medications:    heparin ADULT infusion 100 units/mL (25000 units/236mL), 1,100 Units/hr, Intravenous, Continuous, Jardin, Carla G, RPH   sodium chloride flush (NS) 0.9 % injection 3 mL, 3 mL, Intravenous, Once, Alvira Monday, MD  Current Outpatient Medications:    lisinopril (PRINIVIL,ZESTRIL) 40 MG tablet, Take 20 mg by mouth daily. Pt stated he takes 1/2 tablet everyday since a year ago., Disp: , Rfl:    loratadine (CLARITIN) 10 MG tablet, Take 10 mg by mouth daily as needed for allergies., Disp: , Rfl:  rosuvastatin (CRESTOR) 5 MG tablet, Take 5 mg by mouth daily., Disp: , Rfl:    traZODone (DESYREL) 100 MG tablet, Take 50 mg by mouth at bedtime. Pt stated he took 1/2 tablet the night of 07/15/22, Disp: , Rfl:    amLODipine (NORVASC) 10 MG tablet, Take 10 mg by mouth daily. (Patient not taking: Reported on 07/16/2022), Disp: , Rfl:    aspirin 81 MG tablet, Take 81 mg by mouth daily. (Patient not taking: Reported on 07/16/2022), Disp: , Rfl:    valACYclovir (VALTREX) 1000 MG tablet,  Take 1,000 mg by mouth 2 (two) times daily. (Patient not taking: Reported on 07/16/2022), Disp: , Rfl:   Exam: Current vital signs: BP 129/83   Pulse 91   Temp 98 F (36.7 C) (Oral)   Resp 20   Ht 5\' 7"  (1.702 m)   Wt 80.7 kg   SpO2 100%   BMI 27.88 kg/m  Vital signs in last 24 hours: Temp:  [98 F (36.7 C)-98.7 F (37.1 C)] 98 F (36.7 C) (04/18 1200) Pulse Rate:  [89-106] 91 (04/18 1245) Resp:  [15-20] 20 (04/18 1245) BP: (129-159)/(81-94) 129/83 (04/18 1245) SpO2:  [97 %-100 %] 100 % (04/18 1245) Weight:  [80.7 kg] 80.7 kg (04/18 1003)  GENERAL: Awake, alert, in no acute distress Psych: Affect appropriate for situation, patient is calm and cooperative with examination Head: Normocephalic and atraumatic, without obvious abnormality EENT: Normal conjunctivae, dry mucous membranes, no OP obstruction LUNGS: Normal respiratory effort. Non-labored breathing on room air CV: Regular rate and rhythm on telemetry ABDOMEN: Soft, non-tender, non-distended Extremities: Warm, well perfused, without obvious deformity  NEURO:  Mental Status: Awake, alert, and oriented to person, place, time, and situation. He is able to provide a clear and coherent history of present illness. Speech/Language: speech is intact without dysarthria.   Naming, repetition, fluency, and comprehension intact without aphasia. No neglect is noted Cranial Nerves:  II: PERRL. Visual fields full.  III, IV, VI: EOMI without ptosis, nystagmus, or diplopia.  V: Sensation is intact to light touch and symmetrical to face.  VII: Face is symmetric resting and smiling.  VIII: Hearing is intact to voice IX, X: Palate elevation is symmetric. Phonation normal.  XI: Normal sternocleidomastoid and trapezius muscle strength XII: Tongue protrudes midline without fasciculations.   Motor: 5/5 strength is all muscle groups without vertical drift or unilateral weakness.  Tone is normal. Bulk is normal.  Sensation: Intact to  light touch bilaterally in all four extremities. No extinction to DSS present.  Coordination: FTN intact bilaterally. HKS intact bilaterally. No pronator drift. Alternating hand movements.  Gait: Deferred  Labs I have reviewed labs in epic and the results pertinent to this consultation are: CBC    Component Value Date/Time   WBC 5.9 07/16/2022 1017   RBC 5.04 07/16/2022 1017   HGB 15.0 07/16/2022 1034   HCT 44.0 07/16/2022 1034   PLT 281 07/16/2022 1017   MCV 86.1 07/16/2022 1017   MCH 30.2 07/16/2022 1017   MCHC 35.0 07/16/2022 1017   RDW 11.6 07/16/2022 1017   LYMPHSABS 1.8 07/16/2022 1017   MONOABS 0.5 07/16/2022 1017   EOSABS 0.1 07/16/2022 1017   BASOSABS 0.0 07/16/2022 1017   CMP     Component Value Date/Time   NA 138 07/16/2022 1034   K 4.1 07/16/2022 1034   CL 101 07/16/2022 1034   CO2 26 07/16/2022 1017   GLUCOSE 105 (H) 07/16/2022 1034   BUN 18 07/16/2022 1034  CREATININE 1.00 07/16/2022 1034   CALCIUM 9.6 07/16/2022 1017   PROT 7.1 07/16/2022 1017   ALBUMIN 4.3 07/16/2022 1017   AST 26 07/16/2022 1017   ALT 37 07/16/2022 1017   ALKPHOS 48 07/16/2022 1017   BILITOT 1.0 07/16/2022 1017   GFRNONAA >60 07/16/2022 1017   Lipid Panel  No results found for: "CHOL", "TRIG", "HDL", "CHOLHDL", "VLDL", "LDLCALC", "LDLDIRECT" No results found for: "HGBA1C"  Imaging I have reviewed the images obtained:  CT-scan of the brain 4/18: 1. Asymmetrically hyperdense appearance of the left sigmoid/transverse sinus junction, concerning for dural venous sinus thrombosis. Recommend further evaluation with a CT venogram. 2. Linear hyperdensity in the left middle cranial fossa is nonspecific, but could represent a small amount of subarachnoid blood products. 3. Frothy secretions in the right sphenoid sinus, which can be seen in the setting of acute sinusitis.  CT venogram head 4/18: Filling defect in the left transverse sinus, left sigmoid sinus, and left jugular bulb,  concerning for thrombosis.  MRI brain wo contrast 4/18: 1. Abnormal signal in the left transverse and sigmoid sinuses and left jugular bulb, compatible with dural venous sinus thrombosis seen on same day CT venogram. No evidence of adjacent acute infarct. 2. Small focus of sulcal FLAIR hyperintensity in the posterior aspect of the left sylvian fissure is compatible with small volume acute subarachnoid hemorrhage. 3. On susceptibility weighted imaging prominent serpiginous vessels in the left cerebellum, left parieto-occipital and posterior temporal regions (adjacent to the thrombosed sinuses). This probably represents venous congestion due to the dural venous thrombosis described above, but MRA is recommended to exclude dural AV fistula or AVM as a cause of the patient's sinus thrombosis.  Assessment: 57 y.o. male with PMHx of HTN, HLD, and sleep apnea who presented to the ED 4/18 with 2 days of fluctuating confusion, trouble comprehending speech, various speech output deficits including slurred speech, gibberish speech, and word-finding difficulties, in conjunction with a dull, frontal, aching headache 4/10 in severity with increased pressure sensation while laying flat that was not relieved by ibuprofen.  - Neurological exam is nonfocal - Imaging is positive for left sided dural venous sinus thrombosis involving the left transverse sinus, left sigmoid sinus, and left jugular bulb. Venous engorgement in the region of the upstream draining veins is seen on MRI. Also noted on MRI is a small focus of possible subarachnoid blood.  - He has no prior history of blood clots. No recent infections or vaccinations. No head injury. No infectious findings are seen adjacent or near to the thrombosed vessels on imaging.  - Initiation of heparin is indicated. Overall morbidity/mortality risk associated with the possible small subarachnoid hemorrhage worsening while on heparin is less than the overall  morbidity/mortality risk associated with clot propagation and massive stroke if off heparin. Neurology attending has discussed this with the patient who expresses understanding and agreement with the plan. All questions answered.  - The patient has been started on heparin per neuro protocol and is being admitted for further work up.   Impression: New onset headache and transient episodes of aphasia in the setting of acute left sided dural venous sinus thrombosis   Recommendations: - Heparin gtt neuro protocol, no bolus - Frequent neuro checks  - Stroke team to follow tomorrow  - Hypercoagulable work up as add on to prior blood draw. Note added to order specifies that blood sample must be from before 3:57 PM, when heparin was started. - MRA head has been ordered per Radiology recommendations  Pt seen by NP/Neuro and later by MD. Note/plan to be edited by MD as needed.  Lanae Boast, AGAC-NP [facility] Pager: [phone]  I have seen and examined the patient. I have formulated the assessment and recommendations. 57 year old male who presents with acute onset of left sided headache and transient episodes of aphasia. Imaging reveals dural venous sinus thrombosis involving the left transverse sinus, left sigmoid sinus, and left jugular bulb. Neurological exam is nonfocal. Recommendations as above.  Electronically signed: Dr. Caryl Pina

## 2022-07-16 NOTE — ED Notes (Signed)
ED TO INPATIENT HANDOFF REPORT  ED Nurse Name and Phone #:   Helmut Muster [phone]  S Name/Age/Gender Donald Galloway 57 y.o. male Room/Bed: 035C/035C  Code Status   Code Status: Full Code  Home/SNF/Other Home Patient oriented to: situation Is this baseline? Yes   Triage Complete: Triage complete  Chief Complaint TIA (transient ischemic attack) [G45.9]  Triage Note Pt. Stated, I was at work yesterday and I could not remember peoples name I work with everyday and things people saying didn't make sense. I went home and had a headache. I went back to work today and the same thing was going on. I could not comprehend what is being said which should be normal to me.  Alert and oriented 4, No deficits, NIH screen neg.   Allergies Allergies  Allergen Reactions   Zolpidem Tartrate     Leaves him groggy in the AM    Level of Care/Admitting Diagnosis ED Disposition     ED Disposition  Admit   Condition  --   Comment  Hospital Area: [facility] [[id number]]  Level of Care: Telemetry Medical [[id number]]  May admit patient to [facility] or [facility] if equivalent level of care is available:: No  Covid Evaluation: Asymptomatic - no recent exposure (last 10 days) testing not required  Diagnosis: TIA (transient ischemic attack) [960454]  Admitting Physician: Em[facility] General [[id number]]  Attending Physician: [facility] [[id number]]  Certification:: I certify this patient will need inpatient services for at least 2 midnights  Estimated Length of Stay: 2          B Medical/Surgery History Past Medical History:  Diagnosis Date   Herpes simplex    Hyperlipidemia    Hypertension    Insomnia    Palpitations    evaluated by DR Eldridge Dace   Sleep apnea    Past Surgical History:  Procedure Laterality Date   NOSE SURGERY     DR Annalee Genta     A IV Location/Drains/Wounds Patient Lines/Drains/Airways Status     Active Line/Drains/Airways     Name Placement date Placement  time Site Days   Peripheral IV 07/16/22 20 G Right Antecubital 07/16/22  1240  Antecubital  less than 1            Intake/Output Last 24 hours No intake or output data in the 24 hours ending 07/16/22 1906  Labs/Imaging Results for orders placed or performed during the hospital encounter of 07/16/22 (from the past 48 hour(s))  Protime-INR     Status: None   Collection Time: 07/16/22 10:17 AM  Result Value Ref Range   Prothrombin Time 12.9 11.4 - 15.2 seconds   INR 1.0 0.8 - 1.2    Comment: (NOTE) INR goal varies based on device and disease states. Performed at [facility], [address], [place], [place] [address]   APTT     Status: None   Collection Time: 07/16/22 10:17 AM  Result Value Ref Range   aPTT 27 24 - 36 seconds    Comment: Performed at [facility], [address], [place], [place] [address]  CBC     Status: None   Collection Time: 07/16/22 10:17 AM  Result Value Ref Range   WBC 5.9 4.0 - 10.5 K/uL   RBC 5.04 4.22 - 5.81 MIL/uL   Hemoglobin 15.2 13.0 - 17.0 g/dL   HCT 69.6 29.5 - 28.4 %   MCV 86.1 80.0 - 100.0 fL  MCH 30.2 26.0 - 34.0 pg   MCHC 35.0 30.0 - 36.0 g/dL   RDW 40.9 81.1 - 91.4 %   Platelets 281 150 - 400 K/uL   nRBC 0.0 0.0 - 0.2 %    Comment: Performed at [facility], [address] [place], [place] [address]  Differential     Status: None   Collection Time: 07/16/22 10:17 AM  Result Value Ref Range   Neutrophils Relative % 59 %   Neutro Abs 3.4 1.7 - 7.7 K/uL   Lymphocytes Relative 30 %   Lymphs Abs 1.8 0.7 - 4.0 K/uL   Monocytes Relative 8 %   Monocytes Absolute 0.5 0.1 - 1.0 K/uL   Eosinophils Relative 2 %   Eosinophils Absolute 0.1 0.0 - 0.5 K/uL   Basophils Relative 1 %   Basophils Absolute 0.0 0.0 - 0.1 K/uL   Immature Granulocytes 0 %   Abs Immature Granulocytes 0.02 0.00 - 0.07 K/uL    Comment: Performed at [facility], [address], [place], [place] [address]  Comprehensive metabolic panel      Status: Abnormal   Collection Time: 07/16/22 10:17 AM  Result Value Ref Range   Sodium 137 135 - 145 mmol/L   Potassium 4.0 3.5 - 5.1 mmol/L   Chloride 101 98 - 111 mmol/L   CO2 26 22 - 32 mmol/L   Glucose, Bld 108 (H) 70 - 99 mg/dL    Comment: Glucose reference range applies only to samples taken after fasting for at least 8 hours.   BUN 15 6 - 20 mg/dL   Creatinine, Ser 8.65 0.61 - 1.24 mg/dL   Calcium 9.6 8.9 - 78.4 mg/dL   Total Protein 7.1 6.5 - 8.1 g/dL   Albumin 4.3 3.5 - 5.0 g/dL   AST 26 15 - 41 U/L   ALT 37 0 - 44 U/L   Alkaline Phosphatase 48 38 - 126 U/L   Total Bilirubin 1.0 0.3 - 1.2 mg/dL   GFR, Estimated >69 >62 mL/min    Comment: (NOTE) Calculated using the CKD-EPI Creatinine Equation (2021)    Anion gap 10 5 - 15    Comment: Performed at [facility], [address] [place], [place] [address]  Ethanol     Status: None   Collection Time: 07/16/22 10:17 AM  Result Value Ref Range   Alcohol, Ethyl (B) <10 <10 mg/dL    Comment: (NOTE) Lowest detectable limit for serum alcohol is 10 mg/dL.  For medical purposes only. Performed at [facility], [address], [place], [place] [address]   I-stat chem 8, ED     Status: Abnormal   Collection Time: 07/16/22 10:34 AM  Result Value Ref Range   Sodium 138 135 - 145 mmol/L   Potassium 4.1 3.5 - 5.1 mmol/L   Chloride 101 98 - 111 mmol/L   BUN 18 6 - 20 mg/dL   Creatinine, Ser 0.10 0.61 - 1.24 mg/dL   Glucose, Bld 272 (H) 70 - 99 mg/dL    Comment: Glucose reference range applies only to samples taken after fasting for at least 8 hours.   Calcium, Ion 1.21 1.15 - 1.40 mmol/L   TCO2 26 22 - 32 mmol/L   Hemoglobin 15.0 13.0 - 17.0 g/dL   HCT 53.6 64.4 - 03.4 %   MR BRAIN WO CONTRAST  Result Date: 07/16/2022 CLINICAL DATA:  Transient ischemic attack (TIA) EXAM: MRI HEAD WITHOUT CONTRAST TECHNIQUE: Multiplanar,  multiecho pulse sequences of the brain and surrounding structures were obtained without  intravenous contrast. COMPARISON:  None Available. FINDINGS: Brain: No acute infarction, hydrocephalus, extra-axial collection or mass lesion. Small focus of sulcal FLAIR hyperintensity in the posterior aspect of the left sylvian fissure is compatible with small volume acute subarachnoid hemorrhage. On susceptibility weighted imaging prominent serpiginous vessels in the left cerebellum, left parieto-occipital and posterior temporal regions. Vascular: Abnormal signal in the left transverse and sigmoid sinuses and left jugular bulb. Major arterial flow voids are maintained at the skull base. Skull and upper cervical spine: Normal marrow signal. Sinuses/Orbits: Negative. IMPRESSION: 1. Abnormal signal in the left transverse and sigmoid sinuses and left jugular bulb, compatible with dural venous sinus thrombosis seen on same day CT venogram. No evidence of adjacent acute infarct. 2. Small focus of sulcal FLAIR hyperintensity in the posterior aspect of the left sylvian fissure is compatible with small volume acute subarachnoid hemorrhage. 3. On susceptibility weighted imaging prominent serpiginous vessels in the left cerebellum, left parieto-occipital and posterior temporal regions (adjacent to the thrombosed sinuses). This probably represents venous congestion due to the dural venous thrombosis described above, but MRA is recommended to exclude dural AV fistula or AVM as a cause of the patient's sinus thrombosis. Electronically Signed   By: Feliberto Harts M.D.   On: 07/16/2022 16:09   CT VENOGRAM HEAD  Result Date: 07/16/2022 CLINICAL DATA:  Dural venous sinus thrombosis suspected EXAM: CT VENOGRAM HEAD TECHNIQUE: Venographic phase images of the brain were obtained following the administration of intravenous contrast. Multiplanar reformats and maximum intensity projections were generated. RADIATION DOSE REDUCTION: This exam was performed according to the departmental dose-optimization program which includes  automated exposure control, adjustment of the mA and/or kV according to patient size and/or use of iterative reconstruction technique. CONTRAST:  75mL OMNIPAQUE IOHEXOL 350 MG/ML SOLN COMPARISON:  No prior CT venogram available, correlation is made with CT head 07/16/2022 10:40 a.m. FINDINGS: Filling defect in the left transverse sinus (series 3, images 12-13 and series 7, images 169-22), left sigmoid sinus (series 7, images 137-151 and series 3, images 5-9), and in the jugular bulb (series 3, images 2-4 and series 7, images 124-130). Otherwise patent venous sinuses. IMPRESSION: Filling defect in the left transverse sinus, left sigmoid sinus, and left jugular bulb, concerning for thrombosis. These results were called by telephone at the time of interpretation on 07/16/2022 at 2:22 pm to provider [facility] RAY , who verbally acknowledged these results. Electronically Signed   By: Wiliam Ke M.D.   On: 07/16/2022 14:24   CT HEAD WO CONTRAST  Result Date: 07/16/2022 CLINICAL DATA:  Altered mental status EXAM: CT HEAD WITHOUT CONTRAST TECHNIQUE: Contiguous axial images were obtained from the base of the skull through the vertex without intravenous contrast. RADIATION DOSE REDUCTION: This exam was performed according to the departmental dose-optimization program which includes automated exposure control, adjustment of the mA and/or kV according to patient size and/or use of iterative reconstruction technique. COMPARISON:  None Available. FINDINGS: Brain: No evidence of acute infarction, hydrocephalus, or mass lesion/mass effect. Linear hyperdensity in the left middle cranial fossa is nonspecific, but could represent a small amount of subarachnoid blood products (series 3, image 10). Vascular: Asymmetrically hyperdense appearance of the left sigmoid/transverse sinus junction (series 3, image 13). Skull: Normal. Negative for fracture or focal lesion. Sinuses/Orbits: No middle ear or mastoid effusion. Frothy secretions  in the right sphenoid sinus, which can be seen in the setting of acute sinusitis. Orbits are  unremarkable. Other: None. IMPRESSION: 1. Asymmetrically hyperdense appearance of the left sigmoid/transverse sinus junction, concerning for dural venous sinus thrombosis. Recommend further evaluation with a CT venogram. 2. Linear hyperdensity in the left middle cranial fossa is nonspecific, but could represent a small amount of subarachnoid blood products. 3. Frothy secretions in the right sphenoid sinus, which can be seen in the setting of acute sinusitis. Electronically Signed   By: Lorenza Cambridge M.D.   On: 07/16/2022 11:13    Pending Labs Unresulted Labs (From admission, onward)     Start     Ordered   07/17/22 0500  CBC  Daily,   R      07/16/22 1458   07/17/22 0500  Heparin level (unfractionated)  Daily,   R      07/16/22 1458   07/17/22 0500  Lipid panel  (Labs)  Tomorrow morning,   R       Comments: Fasting    07/16/22 1733   07/16/22 2100  Heparin level (unfractionated)  Once-Timed,   URGENT        07/16/22 1458   07/16/22 1732  HIV Antibody (routine testing w rflx)  (HIV Antibody (Routine testing w reflex) panel)  Once,   R        07/16/22 1733   07/16/22 1732  Hemoglobin A1c  (Labs)  Once,   R       Comments: To assess prior glycemic control    07/16/22 1733            Vitals/Pain Today's Vitals   07/16/22 1245 07/16/22 1600 07/16/22 1700 07/16/22 1830  BP: 129/83 (!) 160/90 127/84 127/81  Pulse: 91 94 90 84  Resp: 20 18 (!) 21 18  Temp:  98.1 F (36.7 C) 98 F (36.7 C) 98.1 F (36.7 C)  TempSrc:   Oral Oral  SpO2: 100% 100% 97% 98%  Weight:      Height:      PainSc:  0-No pain  0-No pain    Isolation Precautions No active isolations  Medications Medications  sodium chloride flush (NS) 0.9 % injection 3 mL (3 mLs Intravenous Not Given 07/16/22 1340)  heparin ADULT infusion 100 units/mL (25000 units/259mL) (1,100 Units/hr Intravenous New Bag/Given 07/16/22 1557)   hydrALAZINE (APRESOLINE) injection 5 mg (has no administration in time range)  lisinopril (ZESTRIL) tablet 20 mg (has no administration in time range)  rosuvastatin (CRESTOR) tablet 5 mg (has no administration in time range)  amLODipine (NORVASC) tablet 5 mg (5 mg Oral Given 07/16/22 1830)  traZODone (DESYREL) tablet 50 mg (has no administration in time range)  loratadine (CLARITIN) tablet 10 mg (has no administration in time range)   stroke: early stages of recovery book (has no administration in time range)  acetaminophen (TYLENOL) tablet 650 mg (has no administration in time range)    Or  acetaminophen (TYLENOL) 160 MG/5ML solution 650 mg (has no administration in time range)    Or  acetaminophen (TYLENOL) suppository 650 mg (has no administration in time range)  iohexol (OMNIPAQUE) 350 MG/ML injection 75 mL (75 mLs Intravenous Contrast Given 07/16/22 1316)    Mobility walks     Focused Assessments Cardiac Assessment Handoff:    No results found for: "CKTOTAL", "CKMB", "CKMBINDEX", "TROPONINI" No results found for: "DDIMER" Does the Patient currently have chest pain? No    R Recommendations: See Admitting Provider Note  Report given to:   Additional Notes:

## 2022-07-16 NOTE — ED Provider Notes (Signed)
[place] EMERGENCY DEPARTMENT AT [facility] Provider Note   CSN: [id number] Arrival date & time: 07/16/22  0932    History  Chief Complaint  Patient presents with   Altered Mental Status   stroke like symptoms   Headache    Donald Galloway is a 57 y.o. male here for evaluation of intermittent altered mental status.  Yesterday around lunch he was at work when noted to have difficulty comprehending people as well as difficulty speaking.  Associated mild headache.  Denies any sudden onset thunderclap headache.  He went home.  He noted some intermittent symptoms at home however they seem to self resolved.  He was at work again today when he was typing on the computer.  He got up to speak with a coworker and had similar symptoms of difficulty understanding what people were saying and not remembering names of people at work that he has been working with for many years.  He was not able to respond to them appropriately at that time.  When he got back to his computer he realized that he had type previously was gibberish.  Denies any current symptoms aside from a mild aching headache.  States he does have some rhinorrhea which he relates to seasonal allergies.  No numbness, weakness, blurred vision, difficulty ambulating.  No prior history of PE or DVT.  No pain or swelling to lower extremities.  No chest pain or shortness of breath.  No fever.  No meds PTA.  He has never had anything like this before  HPI     Home Medications Prior to Admission medications   Medication Sig Start Date End Date Taking? Authorizing Provider  lisinopril (PRINIVIL,ZESTRIL) 40 MG tablet Take 20 mg by mouth daily. Pt stated he takes 1/2 tablet everyday since a year ago.   Yes [provider]  loratadine (CLARITIN) 10 MG tablet Take 10 mg by mouth daily as needed for allergies.   Yes [provider]  rosuvastatin (CRESTOR) 5 MG tablet Take 5 mg by mouth daily.   Yes [provider]   traZODone (DESYREL) 100 MG tablet Take 50 mg by mouth at bedtime. Pt stated he took 1/2 tablet the night of 07/15/22   Yes [provider]  amLODipine (NORVASC) 10 MG tablet Take 10 mg by mouth daily. Patient not taking: Reported on 07/16/2022    [provider]  aspirin 81 MG tablet Take 81 mg by mouth daily. Patient not taking: Reported on 07/16/2022    [provider]  valACYclovir (VALTREX) 1000 MG tablet Take 1,000 mg by mouth 2 (two) times daily. Patient not taking: Reported on 07/16/2022    [provider]      Allergies    Zolpidem tartrate    Review of Systems   Review of Systems  Constitutional: Negative.   HENT: Negative.    Respiratory: Negative.    Cardiovascular: Negative.   Gastrointestinal: Negative.   Genitourinary: Negative.   Musculoskeletal: Negative.   Skin: Negative.   Neurological:  Positive for speech difficulty and headaches. Negative for dizziness, tremors, seizures, syncope, facial asymmetry, weakness, light-headedness and numbness.  All other systems reviewed and are negative.   Physical Exam Updated Vital Signs BP 131/81   Pulse 89   Temp 98 F (36.7 C) (Oral)   Resp 16   Ht  (1.702 m)   Wt 80.7 kg   SpO2 100%   BMI 27.88 kg/m  Physical Exam Vitals and nursing note  reviewed.  Constitutional:      General: He is not in acute distress.    Appearance: He is well-developed. He is not ill-appearing, toxic-appearing or diaphoretic.  HENT:     Head: Normocephalic and atraumatic.  Eyes:     Extraocular Movements: Extraocular movements intact.     Pupils: Pupils are equal, round, and reactive to light.     Comments: PERRLA  Cardiovascular:     Rate and Rhythm: Normal rate and regular rhythm.     Heart sounds: Normal heart sounds.  Pulmonary:     Effort: Pulmonary effort is normal. No respiratory distress.     Breath sounds: Normal breath sounds.  Abdominal:     General: Bowel sounds are normal. There is  no distension.     Palpations: Abdomen is soft.  Musculoskeletal:        General: Normal range of motion.     Cervical back: Normal range of motion and neck supple.     Comments: Compartment soft, no bony tenderness  Skin:    General: Skin is warm and dry.     Capillary Refill: Capillary refill takes less than 2 seconds.  Neurological:     General: No focal deficit present.     Mental Status: He is alert and oriented to person, place, and time.     Cranial Nerves: Cranial nerves 2-12 are intact.     Sensory: Sensation is intact.     Motor: Motor function is intact.     Coordination: Coordination is intact.     Gait: Gait is intact.     Comments: Alert and oriented Cranial nerves II through XII grossly intact Intact sensation bilateral upper and lower extremities Equal strength bilaterally Negative heel-to-shin, no pronator drift     ED Results / Procedures / Treatments   Labs (all labs ordered are listed, but only abnormal results are displayed) Labs Reviewed  COMPREHENSIVE METABOLIC PANEL - Abnormal; Notable for the following components:      Result Value   Glucose, Bld 108 (*)    All other components within normal limits  I-STAT CHEM 8, ED - Abnormal; Notable for the following components:   Glucose, Bld 105 (*)    All other components within normal limits  PROTIME-INR  APTT  CBC  DIFFERENTIAL  ETHANOL  HEPARIN LEVEL (UNFRACTIONATED)  CBG MONITORING, ED    EKG None  Radiology CT VENOGRAM HEAD  Result Date: 07/16/2022 CLINICAL DATA:  Dural venous sinus thrombosis suspected EXAM: CT VENOGRAM HEAD TECHNIQUE: Venographic phase images of the brain were obtained following the administration of intravenous contrast. Multiplanar reformats and maximum intensity projections were generated. RADIATION DOSE REDUCTION: This exam was performed according to the departmental dose-optimization program which includes automated exposure control, adjustment of the mA and/or kV  according to patient size and/or use of iterative reconstruction technique. CONTRAST:  75mL OMNIPAQUE IOHEXOL 350 MG/ML SOLN COMPARISON:  No prior CT venogram available, correlation is made with CT head 07/16/2022 10:40 a.m. FINDINGS: Filling defect in the left transverse sinus (series 3, images 12-13 and series 7, images 169-22), left sigmoid sinus (series 7, images 137-151 and series 3, images 5-9), and in the jugular bulb (series 3, images 2-4 and series 7, images 124-130). Otherwise patent venous sinuses. IMPRESSION: Filling defect in the left transverse sinus, left sigmoid sinus, and left jugular bulb, concerning for thrombosis. These results were called by telephone at the time of interpretation on 07/16/2022 at 2:22 pm to provider [facility] RAY , who  verbally acknowledged these results. Electronically Signed   By: Wiliam Ke M.D.   On: 07/16/2022 14:24   CT HEAD WO CONTRAST  Result Date: 07/16/2022 CLINICAL DATA:  Altered mental status EXAM: CT HEAD WITHOUT CONTRAST TECHNIQUE: Contiguous axial images were obtained from the base of the skull through the vertex without intravenous contrast. RADIATION DOSE REDUCTION: This exam was performed according to the departmental dose-optimization program which includes automated exposure control, adjustment of the mA and/or kV according to patient size and/or use of iterative reconstruction technique. COMPARISON:  None Available. FINDINGS: Brain: No evidence of acute infarction, hydrocephalus, or mass lesion/mass effect. Linear hyperdensity in the left middle cranial fossa is nonspecific, but could represent a small amount of subarachnoid blood products (series 3, image 10). Vascular: Asymmetrically hyperdense appearance of the left sigmoid/transverse sinus junction (series 3, image 13). Skull: Normal. Negative for fracture or focal lesion. Sinuses/Orbits: No middle ear or mastoid effusion. Frothy secretions in the right sphenoid sinus, which can be seen in the  setting of acute sinusitis. Orbits are unremarkable. Other: None. IMPRESSION: 1. Asymmetrically hyperdense appearance of the left sigmoid/transverse sinus junction, concerning for dural venous sinus thrombosis. Recommend further evaluation with a CT venogram. 2. Linear hyperdensity in the left middle cranial fossa is nonspecific, but could represent a small amount of subarachnoid blood products. 3. Frothy secretions in the right sphenoid sinus, which can be seen in the setting of acute sinusitis. Electronically Signed   By: Lorenza Cambridge M.D.   On: 07/16/2022 11:13    Procedures .Critical Care  Performed by: Linwood Dibbles, PA-C Authorized by: Linwood Dibbles, PA-C   Critical care provider statement:    Critical care time (minutes):  76   Critical care was necessary to treat or prevent imminent or life-threatening deterioration of the following conditions:  Circulatory failure and CNS failure or compromise   Critical care was time spent personally by me on the following activities:  Development of treatment plan with patient or surrogate, discussions with consultants, evaluation of patient's response to treatment, examination of patient, ordering and review of laboratory studies, ordering and review of radiographic studies, ordering and performing treatments and interventions, pulse oximetry, re-evaluation of patient's condition and review of old charts     Medications Ordered in ED Medications  sodium chloride flush (NS) 0.9 % injection 3 mL (3 mLs Intravenous Not Given 07/16/22 1340)  heparin ADULT infusion 100 units/mL (25000 units/268mL) (has no administration in time range)  iohexol (OMNIPAQUE) 350 MG/ML injection 75 mL (75 mLs Intravenous Contrast Given 07/16/22 1316)    ED Course/ Medical Decision Making/ A&P    57 year old here for evaluation of intermittent difficulty with receptive and expressive aphasia.  Symptoms started yesterday.  Associated mild headache however denies any  sudden onset thunderclap headache.  No infectious symptoms aside from rhinorrhea which she relates to seasonal allergies.  No history of clotting disorders, PE or DVT.  Sx have been waxing and waning over the last 24 hours.  Currently feels at baseline, he has a nonfocal neurologic exam without deficits.   Started from triage which I personally reviewed and interpreted:  CBC without leukocytosis Ethanol less than 10 Metabolic panel without significant abnormality CT head with possible dural venous sinus thrombosis/blood  Discussed results with patient.  Radiology recommends CT venogram which we have ordered.  Patient reassessed.  Does state he has had some positional dizziness prior to his new symptoms.  Pending CT venogram results.  Anticipate will likely need admission  for neurology assessment  ED venogram shows Filling defect in the left transverse sinus, left sigmoid sinus, and left jugular bulb, concerning for thrombosis.  Patient reassessed. I discussed these results with patient.  He is currently neurologically intact.  He again declines any personal or family history of clotting disorders or recent illnesses.  Will plan on discussing with neurology  CONSULT with Dr. Otelia Limes with Neuro who rec heparin no bolus, Neuro prot, MR brain, Medicine admit  CONSULT with Dr. Chipper Herb with [facility] who is agreeable to outpatient for admission actually has  The patient appears reasonably stabilized for admission considering the current resources, flow, and capabilities available in the ED at this time, and I doubt any other [facility] requiring further screening and/or treatment in the ED prior to admission.                             Medical Decision Making Amount and/or Complexity of Data Reviewed External Data Reviewed: labs, radiology, ECG and notes. Labs: ordered. Decision-making details documented in ED Course. Radiology: ordered and independent interpretation performed. Decision-making details  documented in ED Course. ECG/medicine tests: ordered and independent interpretation performed. Decision-making details documented in ED Course.  Risk OTC drugs. Prescription drug management. Parenteral controlled substances. Decision regarding hospitalization. Diagnosis or treatment significantly limited by social determinants of health.          Final Clinical Impression(s) / ED Diagnoses Final diagnoses:  Dural venous sinus thrombosis    Rx / DC Orders ED Discharge Orders     None         Twilia Yaklin A, PA-C 07/16/22 1520    Alvira Monday, MD 07/19/22 [phone]

## 2022-07-16 NOTE — ED Triage Notes (Signed)
Pt. Stated, I was at work yesterday and I could not remember peoples name I work with everyday and things people saying didn't make sense. I went home and had a headache. I went back to work today and the same thing was going on. I could not comprehend what is being said which should be normal to me.  Alert and oriented 4, No deficits, NIH screen neg.

## 2022-07-16 NOTE — Progress Notes (Signed)
ANTICOAGULATION CONSULT NOTE - Initial Consult  Pharmacy Consult for Heparin infusion Indication:  dural venous thrombosis  Allergies  Allergen Reactions   Zolpidem Tartrate     Leaves him groggy in the AM    Patient Measurements: Height:  (170.2 cm) Weight: 80.7 kg (178 lb) IBW/kg (Calculated) : 66.1 Heparin Dosing Weight: 80.7 kg (total body weight)  Vital Signs: Temp: 98.7 F (37.1 C) (04/18 1149) Temp Source: Oral (04/18 1149) BP: 142/82 (04/18 1149) Pulse Rate: 93 (04/18 1149)  Labs: Recent Labs    07/16/22 1017 07/16/22 1034  HGB 15.2 15.0  HCT 43.4 44.0  PLT 281  --   APTT 27  --   LABPROT 12.9  --   INR 1.0  --   CREATININE 1.07 1.00    Estimated Creatinine Clearance: 82.9 mL/min (by C-G formula based on SCr of 1 mg/dL).   Medical History: Past Medical History:  Diagnosis Date   Herpes simplex    Hyperlipidemia    Hypertension    Insomnia    Palpitations    evaluated by DR Eldridge Dace   Sleep apnea     Medications:  (Not in a hospital admission)   Assessment: 57 yo M presents to ED with acute memory loss and aphasia since 07/15/22. CT head c/f dural venous thrombosis. CT venogram shows filling defects in the L transverse sinus, L sigmoid sinus, and in the jugular bulb concerning for thrombosis Pt was not taking any anticoagulation prior to admission. Pharmacy consulted to dose and manage heparin infusion per neuro protocol, no bolus.  Hgb 15, Plt 281  No acute s/sx of bleeding  Goal of Therapy:  Heparin level 0.3-0.5 units/ml Monitor platelets by anticoagulation protocol: Yes   Plan:  Start heparin infusion at 1100 units/hr No bolus, per neuro protocol Check heparin level in 6 hours Monitor daily CBC, heparin level, and for s/sx of bleeding   Wilburn Cornelia, PharmD, BCPS Clinical Pharmacist 07/16/2022 3:07 PM   Please refer to AMION for pharmacy phone number

## 2022-07-16 NOTE — ED Provider Notes (Signed)
Received call from radiologist regarding patient's head CT.  He voices concern for dural venous thrombosis versus subdural hematoma and advises patient have CT venogram performed. Sent message to charge nurse and triage nurse that patient needs further testing and roomed now   Margarita Grizzle, MD 07/16/22 1129

## 2022-07-16 NOTE — ED Notes (Signed)
Patient transported to MRI 

## 2022-07-16 NOTE — ED Provider Triage Note (Signed)
Emergency Medicine Provider Triage Evaluation Note  Donald Galloway , a 57 y.o. male  was evaluated in triage.  Pt complains of 2 episodes of speech and responding to people while he was at work.  Symptoms started yesterday afternoon and were associated with a mild headache.  When he got to work today he was able to have 1 normal conversation and symptoms with he describes it as having difficult for him what people are saying him at work and not remembering names of people he has worked with for years and then not being able to respond to the person.  Review of Systems  Positive: Speech changes, headache Negative: Weakness, numbness, visual changes, dizziness  Physical Exam  BP (!) 159/94 (BP Location: Right Arm)   Pulse (!) 106   Temp 98.3 F (36.8 C)   Resp 16   Ht  (1.702 m)   Wt 80.7 kg   SpO2 98%   BMI 27.88 kg/m  Gen:   Awake, no distress   Resp:  Normal effort  MSK:   Moves extremities without difficulty  Other:  Speech normal, no neurologic deficits  Medical Decision Making  Medically screening exam initiated at 10:08 AM.  Appropriate orders placed.  Roselle Locus was informed that the remainder of the evaluation will be completed by another provider, this initial triage assessment does not replace that evaluation, and the importance of remaining in the ED until their evaluation is complete.  Concern for potential small stroke versus TIA, CT and labs ordered   Dartha Lodge, [place] 07/16/22 1024

## 2022-07-16 NOTE — Progress Notes (Signed)
ANTICOAGULATION CONSULT NOTE - Initial Consult  Pharmacy Consult for Heparin infusion Indication:  dural venous thrombosis  Allergies  Allergen Reactions   Zolpidem Tartrate     Leaves him groggy in the AM    Patient Measurements: Height:  (170.2 cm) Weight: 79.6 kg (175 lb 7.8 oz) IBW/kg (Calculated) : 66.1 Heparin Dosing Weight: 80.7 kg (total body weight)  Vital Signs: Temp: 98.6 F (37 C) (04/18 2017) Temp Source: Oral (04/18 2017) BP: 147/92 (04/18 2017) Pulse Rate: 79 (04/18 2017)  Labs: Recent Labs    07/16/22 1017 07/16/22 1034 07/16/22 2020  HGB 15.2 15.0  --   HCT 43.4 44.0  --   PLT 281  --   --   APTT 27  --   --   LABPROT 12.9  --   --   INR 1.0  --   --   HEPARINUNFRC  --   --  0.24*  CREATININE 1.07 1.00  --      Estimated Creatinine Clearance: 82.4 mL/min (by C-G formula based on SCr of 1 mg/dL).   Medical History: Past Medical History:  Diagnosis Date   Herpes simplex    Hyperlipidemia    Hypertension    Insomnia    Palpitations    evaluated by DR Eldridge Dace   Sleep apnea     Medications:  Medications Prior to Admission  Medication Sig Dispense Refill Last Dose   lisinopril (PRINIVIL,ZESTRIL) 40 MG tablet Take 20 mg by mouth daily. Pt stated he takes 1/2 tablet everyday since a year ago.   07/16/2022   loratadine (CLARITIN) 10 MG tablet Take 10 mg by mouth daily as needed for allergies.   07/16/2022   rosuvastatin (CRESTOR) 5 MG tablet Take 5 mg by mouth daily.   07/16/2022   traZODone (DESYREL) 100 MG tablet Take 50 mg by mouth at bedtime. Pt stated he took 1/2 tablet the night of 07/15/22   07/15/2022   amLODipine (NORVASC) 10 MG tablet Take 10 mg by mouth daily. (Patient not taking: Reported on 07/16/2022)   Not Taking   aspirin 81 MG tablet Take 81 mg by mouth daily. (Patient not taking: Reported on 07/16/2022)   Not Taking   valACYclovir (VALTREX) 1000 MG tablet Take 1,000 mg by mouth 2 (two) times daily. (Patient not taking: Reported  on 07/16/2022)   Not Taking    Assessment: 57 yo M presents to ED with acute memory loss and aphasia since 07/15/22. CT head c/f dural venous thrombosis. CT venogram shows filling defects in the L transverse sinus, L sigmoid sinus, and in the jugular bulb concerning for thrombosis Pt was not taking any anticoagulation prior to admission. Pharmacy consulted to dose and manage heparin infusion per neuro protocol, no bolus.   HL 0.24 - subtherapeutic  No issues with line/infusion and no s/sx bleeding per RN  Goal of Therapy:  Heparin level 0.3-0.5 units/ml Monitor platelets by anticoagulation protocol: Yes   Plan:  Increase heparin infusion to 1200 units/hr No bolus, per neuro protocol Check heparin level in 6 hours with AM labs  Monitor daily CBC, heparin level, and for s/sx of bleeding   Calton Dach, PharmD Clinical Pharmacist 07/16/2022 9:41 PM

## 2022-07-17 ENCOUNTER — Inpatient Hospital Stay (HOSPITAL_COMMUNITY): Payer: 59

## 2022-07-17 DIAGNOSIS — I10 Essential (primary) hypertension: Secondary | ICD-10-CM

## 2022-07-17 DIAGNOSIS — D6859 Other primary thrombophilia: Secondary | ICD-10-CM

## 2022-07-17 DIAGNOSIS — E785 Hyperlipidemia, unspecified: Secondary | ICD-10-CM | POA: Diagnosis not present

## 2022-07-17 DIAGNOSIS — G08 Intracranial and intraspinal phlebitis and thrombophlebitis: Secondary | ICD-10-CM

## 2022-07-17 DIAGNOSIS — G459 Transient cerebral ischemic attack, unspecified: Secondary | ICD-10-CM | POA: Diagnosis not present

## 2022-07-17 DIAGNOSIS — I609 Nontraumatic subarachnoid hemorrhage, unspecified: Secondary | ICD-10-CM | POA: Diagnosis not present

## 2022-07-17 LAB — CBC
HCT: 44.1 % (ref 39.0–52.0)
Hemoglobin: 15.7 g/dL (ref 13.0–17.0)
MCH: 30.4 pg (ref 26.0–34.0)
MCHC: 35.6 g/dL (ref 30.0–36.0)
MCV: 85.5 fL (ref 80.0–100.0)
Platelets: 273 10*3/uL (ref 150–400)
RBC: 5.16 MIL/uL (ref 4.22–5.81)
RDW: 11.5 % (ref 11.5–15.5)
WBC: 7 10*3/uL (ref 4.0–10.5)
nRBC: 0 % (ref 0.0–0.2)

## 2022-07-17 LAB — RAPID URINE DRUG SCREEN, HOSP PERFORMED
Amphetamines: NOT DETECTED
Barbiturates: NOT DETECTED
Benzodiazepines: NOT DETECTED
Cocaine: NOT DETECTED
Opiates: NOT DETECTED
Tetrahydrocannabinol: NOT DETECTED

## 2022-07-17 LAB — LIPID PANEL
Cholesterol: 174 mg/dL (ref 0–200)
HDL: 55 mg/dL (ref >40)
LDL Cholesterol: 99 mg/dL (ref 0–99)
Total CHOL/HDL Ratio: 3.2 RATIO
Triglycerides: 98 mg/dL (ref <150)
VLDL: 20 mg/dL (ref 0–40)

## 2022-07-17 LAB — ANTITHROMBIN III: AntiThromb III Func: 102 % (ref 75–120)

## 2022-07-17 LAB — ECHOCARDIOGRAM COMPLETE
Area-P 1/2: 3.37 cm2
Calc EF: 59.9 %
Height: 67 in
S' Lateral: 3.2 cm
Single Plane A2C EF: 60.3 %
Single Plane A4C EF: 60.5 %
Weight: 2807.78 oz

## 2022-07-17 LAB — HEPARIN LEVEL (UNFRACTIONATED)
Heparin Unfractionated: 0.6 IU/mL (ref 0.30–0.70)
Heparin Unfractionated: 0.57 IU/mL (ref 0.30–0.70)

## 2022-07-17 MED ORDER — IOHEXOL 350 MG/ML SOLN
75.0000 mL | Freq: Once | INTRAVENOUS | Status: AC | PRN
  Administered 2022-07-17: 75 mL via INTRAVENOUS

## 2022-07-17 MED ORDER — ROSUVASTATIN CALCIUM 20 MG PO TABS
20.0000 mg | ORAL_TABLET | Freq: Every day | ORAL | Status: DC
  Administered 2022-07-18 – 2022-07-24 (×7): 20 mg via ORAL
  Filled 2022-07-17 (×7): qty 1

## 2022-07-17 NOTE — Progress Notes (Signed)
Bilateral lower extremity venous duplex has been completed. Preliminary results can be found in CV Proc through chart review.   07/17/22 1:05 PM Greg Nakenya Theall RVT   

## 2022-07-17 NOTE — Assessment & Plan Note (Signed)
Small, also likely due to AVM

## 2022-07-17 NOTE — Progress Notes (Signed)
Echocardiogram 2D Echocardiogram has been performed.  Toni Amend 07/17/2022, 10:53 AM

## 2022-07-17 NOTE — Progress Notes (Signed)
  Transition of Care [facility]) Screening Note   Patient Details  Name: Donald Galloway Date of Birth: [birth year]   Transition of Care [facility] CM/SW Contact:    Baldemar Lenis, LCSW Phone Number: 07/17/2022, 4:27 PM    Transition of Care Department [facility]) has reviewed patient and no TOC needs have been identified at this time. We will continue to monitor patient advancement through interdisciplinary progression rounds. If new patient transition needs arise, please place a TOC consult.

## 2022-07-17 NOTE — Hospital Course (Addendum)
Mr. Carswell is a 57 y.o. M with HTN, HLD and OSA who presented with 2 days waxing and waning language disturbance and frontal HA.   In the ER, [facility] showed dural sinus thrombosis, confirmed with CT venogram.  MRI showed small SAH.   4/18: Started on heparin, Neurology consulted

## 2022-07-17 NOTE — Progress Notes (Addendum)
OT Cancellation Note  Patient Details Name: MATAEO INGWERSEN MRN: [id number] DOB: 03/03/1966   Cancelled Treatment:    Reason Eval/Treat Not Completed: OT screened, no needs identified, will sign off. Per PT patient independent with ADLs and mobility, pt reports at baseline with cognition, ADLs or mobility.  No further needs identified.   Barry Brunner, OT Acute Rehabilitation Services Office [phone]   Chancy Milroy 07/17/2022, 10:08 AM

## 2022-07-17 NOTE — Progress Notes (Addendum)
ANTICOAGULATION CONSULT NOTE - Initial Consult  Pharmacy Consult for Heparin infusion Indication:  dural venous thrombosis  Allergies  Allergen Reactions   Zolpidem Tartrate     Leaves him groggy in the AM    Patient Measurements: Height:  (170.2 cm) Weight: 79.6 kg (175 lb 7.8 oz) IBW/kg (Calculated) : 66.1 Heparin Dosing Weight: 80.7 kg (total body weight)  Vital Signs: Temp: 97.6 F (36.4 C) (04/19 0425) Temp Source: Oral (04/19 0425) BP: 129/88 (04/19 0425) Pulse Rate: 78 (04/19 0425)  Labs: Recent Labs    07/16/22 1017 07/16/22 1034 07/16/22 2020 07/17/22 0412  HGB 15.2 15.0  --  15.7  HCT 43.4 44.0  --  44.1  PLT 281  --   --  273  APTT 27  --   --   --   LABPROT 12.9  --   --   --   INR 1.0  --   --   --   HEPARINUNFRC  --   --  0.24* 0.60  CREATININE 1.07 1.00  --   --      Estimated Creatinine Clearance: 82.4 mL/min (by C-G formula based on SCr of 1 mg/dL).   Medical History: Past Medical History:  Diagnosis Date   Herpes simplex    Hyperlipidemia    Hypertension    Insomnia    Palpitations    evaluated by DR Eldridge Dace   Sleep apnea     Medications:  Medications Prior to Admission  Medication Sig Dispense Refill Last Dose   lisinopril (PRINIVIL,ZESTRIL) 40 MG tablet Take 20 mg by mouth daily. Pt stated he takes 1/2 tablet everyday since a year ago.   07/16/2022   loratadine (CLARITIN) 10 MG tablet Take 10 mg by mouth daily as needed for allergies.   07/16/2022   rosuvastatin (CRESTOR) 5 MG tablet Take 5 mg by mouth daily.   07/16/2022   traZODone (DESYREL) 100 MG tablet Take 50 mg by mouth at bedtime. Pt stated he took 1/2 tablet the night of 07/15/22   07/15/2022   amLODipine (NORVASC) 10 MG tablet Take 10 mg by mouth daily. (Patient not taking: Reported on 07/16/2022)   Not Taking   aspirin 81 MG tablet Take 81 mg by mouth daily. (Patient not taking: Reported on 07/16/2022)   Not Taking   valACYclovir (VALTREX) 1000 MG tablet Take 1,000 mg by  mouth 2 (two) times daily. (Patient not taking: Reported on 07/16/2022)   Not Taking    Assessment: 57 yo M presents to ED with acute memory loss and aphasia since 07/15/22. CT head c/f dural venous thrombosis. CT venogram shows filling defects in the L transverse sinus, L sigmoid sinus, and in the jugular bulb concerning for thrombosis Pt was not taking any anticoagulation prior to admission. Pharmacy consulted to dose and manage heparin infusion per neuro protocol, no bolus.  4/19 AM update HL 0.6 - supra-subtherapeutic  Hgb 15.7 / PLT 273K No issues with line/infusion and no s/sx bleeding per RN  Goal of Therapy:  Heparin level 0.3-0.5 units/ml Monitor platelets by anticoagulation protocol: Yes   Plan:  Decrease heparin infusion to 1100 units/hr Check heparin level in 8 hours with AM labs  Monitor daily CBC, heparin level, and for s/sx of bleeding   Zuha Dejonge BS, PharmD, BCPS Clinical Pharmacist 07/17/2022 7:05 AM  Contact: [phone] after 3 PM  "Be curious, not judgmental..." -Debbora Dus

## 2022-07-17 NOTE — Progress Notes (Addendum)
STROKE TEAM PROGRESS NOTE   INTERVAL HISTORY No family at the bedside.  Patient sitting in bed in no apparent distress on a heparin drip CTA with dural venous sinus thrombosis, AV fistula Patient states has had no more problems with his speech as he has been in the hospital no other neurological deficits Will have formal cerebral angiogram on Monday  Vitals:   07/17/22 0425 07/17/22 0738 07/17/22 1127 07/17/22 1548  BP: 129/88 136/83 126/82 136/88  Pulse: 78 87 89 100  Resp: 16 16 16 18   Temp: 97.6 F (36.4 C) 98.7 F (37.1 C) (!) 97.5 F (36.4 C) 99.4 F (37.4 C)  TempSrc: Oral Oral Oral Oral  SpO2: 100% 98% 99% 99%  Weight:      Height:       CBC:  Recent Labs  Lab 07/16/22 1017 07/16/22 1034 07/17/22 0412  WBC 5.9  --  7.0  NEUTROABS 3.4  --   --   HGB 15.2 15.0 15.7  HCT 43.4 44.0 44.1  MCV 86.1  --  85.5  PLT 281  --  273   Basic Metabolic Panel:  Recent Labs  Lab 07/16/22 1017 07/16/22 1034  NA 137 138  K 4.0 4.1  CL 101 101  CO2 26  --   GLUCOSE 108* 105*  BUN 15 18  CREATININE 1.07 1.00  CALCIUM 9.6  --    Lipid Panel:  Recent Labs  Lab 07/17/22 0412  CHOL 174  TRIG 98  HDL 55  CHOLHDL 3.2  VLDL 20  LDLCALC 99   HgbA1c:  Recent Labs  Lab 07/16/22 2020  HGBA1C 5.2   Urine Drug Screen:  Recent Labs  Lab 07/16/22 2319  LABOPIA NONE DETECTED  COCAINSCRNUR NONE DETECTED  LABBENZ NONE DETECTED  AMPHETMU NONE DETECTED  THCU NONE DETECTED  LABBARB NONE DETECTED    Alcohol Level  Recent Labs  Lab 07/16/22 1017  ETH <10    IMAGING past 24 hours VAS [place] LOWER EXTREMITY VENOUS (DVT)  Result Date: 07/17/2022  Lower Venous DVT Study Patient Name:  Donald Galloway  Date of Exam:   07/17/2022 Medical Rec #: [id number]       Accession #:    [id number] Date of Birth: 1965/04/08       Patient Gender: M Patient Age:   57 years Exam Location:  [facility] Procedure:      VAS [place] LOWER EXTREMITY VENOUS (DVT) Referring Phys: Marvel Plan  --------------------------------------------------------------------------------  Indications: Thrombophilia [331404].  Risk Factors: None identified. Comparison Study: No prior studies. Performing Technologist: Chanda Busing RVT  Examination Guidelines: A complete evaluation includes B-mode imaging, spectral Doppler, color Doppler, and power Doppler as needed of all accessible portions of each vessel. Bilateral testing is considered an integral part of a complete examination. Limited examinations for reoccurring indications may be performed as noted. The reflux portion of the exam is performed with the patient in reverse Trendelenburg.  +---------+---------------+---------+-----------+----------+--------------+ RIGHT    CompressibilityPhasicitySpontaneityPropertiesThrombus Aging +---------+---------------+---------+-----------+----------+--------------+ CFV      Full           Yes      Yes                                 +---------+---------------+---------+-----------+----------+--------------+ SFJ      Full                                                        +---------+---------------+---------+-----------+----------+--------------+  FV Prox  Full                                                        +---------+---------------+---------+-----------+----------+--------------+ FV Mid   Full                                                        +---------+---------------+---------+-----------+----------+--------------+ FV DistalFull                                                        +---------+---------------+---------+-----------+----------+--------------+ PFV      Full                                                        +---------+---------------+---------+-----------+----------+--------------+ POP      Full           Yes      Yes                                 +---------+---------------+---------+-----------+----------+--------------+ PTV       Full                                                        +---------+---------------+---------+-----------+----------+--------------+ PERO     Full                                                        +---------+---------------+---------+-----------+----------+--------------+   +---------+---------------+---------+-----------+----------+--------------+ LEFT     CompressibilityPhasicitySpontaneityPropertiesThrombus Aging +---------+---------------+---------+-----------+----------+--------------+ CFV      Full           Yes      Yes                                 +---------+---------------+---------+-----------+----------+--------------+ SFJ      Full                                                        +---------+---------------+---------+-----------+----------+--------------+ FV Prox  Full                                                        +---------+---------------+---------+-----------+----------+--------------+  FV Mid   Full                                                        +---------+---------------+---------+-----------+----------+--------------+ FV DistalFull                                                        +---------+---------------+---------+-----------+----------+--------------+ PFV      Full                                                        +---------+---------------+---------+-----------+----------+--------------+ POP      Full           Yes      Yes                                 +---------+---------------+---------+-----------+----------+--------------+ PTV      Full                                                        +---------+---------------+---------+-----------+----------+--------------+ PERO     Full                                                        +---------+---------------+---------+-----------+----------+--------------+     Summary: RIGHT: - There is no evidence of deep vein  thrombosis in the lower extremity.  - No cystic structure found in the popliteal fossa.  LEFT: - There is no evidence of deep vein thrombosis in the lower extremity.  - No cystic structure found in the popliteal fossa.  *See table(s) above for measurements and observations. Electronically signed by Sherald Hess MD on 07/17/2022 at 3:14:38 PM.    Final    ECHOCARDIOGRAM COMPLETE  Result Date: 07/17/2022    ECHOCARDIOGRAM REPORT   Patient Name:   Donald Galloway Date of Exam: 07/17/2022 Medical Rec #:  [id number]      Height:       67.0 in Accession #:    [id number]     Weight:       175.5 lb Date of Birth:  1965-12-11      BSA:          1.913 m Patient Age:    56 years       BP:           129/88 mmHg Patient Gender: M              HR:           86 bpm. Exam Location:  Inpatient Procedure: 2D Echo, Cardiac Doppler and Color Doppler Indications:  TIA  History:        Patient has no prior history of Echocardiogram examinations.                 Risk Factors:Hypertension and Dyslipidemia.  Sonographer:    Mike Gip Referring Phys: [id number] PING T ZHANG IMPRESSIONS  1. Left ventricular ejection fraction, by estimation, is 60 to 65%. The left ventricle has normal function. The left ventricle has no regional wall motion abnormalities. Left ventricular diastolic parameters were normal.  2. Right ventricular systolic function is normal. The right ventricular size is normal.  3. The mitral valve is normal in structure. Trivial mitral valve regurgitation. No evidence of mitral stenosis.  4. The aortic valve was not well visualized. Aortic valve regurgitation is not visualized. No aortic stenosis is present.  5. The inferior vena cava is normal in size with greater than 50% respiratory variability, suggesting right atrial pressure of 3 mmHg. FINDINGS  Left Ventricle: Left ventricular ejection fraction, by estimation, is 60 to 65%. The left ventricle has normal function. The left ventricle has no regional wall motion  abnormalities. The left ventricular internal cavity size was normal in size. There is  no left ventricular hypertrophy. Left ventricular diastolic parameters were normal. Right Ventricle: The right ventricular size is normal. No increase in right ventricular wall thickness. Right ventricular systolic function is normal. Left Atrium: Left atrial size was normal in size. Right Atrium: Right atrial size was normal in size. Pericardium: There is no evidence of pericardial effusion. Mitral Valve: The mitral valve is normal in structure. Trivial mitral valve regurgitation. No evidence of mitral valve stenosis. Tricuspid Valve: The tricuspid valve is normal in structure. Tricuspid valve regurgitation is trivial. No evidence of tricuspid stenosis. Aortic Valve: The aortic valve was not well visualized. Aortic valve regurgitation is not visualized. No aortic stenosis is present. Pulmonic Valve: The pulmonic valve was normal in structure. Pulmonic valve regurgitation is not visualized. No evidence of pulmonic stenosis. Aorta: The aortic root is normal in size and structure. Venous: The inferior vena cava is normal in size with greater than 50% respiratory variability, suggesting right atrial pressure of 3 mmHg. IAS/Shunts: No atrial level shunt detected by color flow Doppler.  LEFT VENTRICLE PLAX 2D LVIDd:         4.80 cm      Diastology LVIDs:         3.20 cm      LV e' medial:    7.62 cm/s LV PW:         0.60 cm      LV E/e' medial:  9.4 LV IVS:        0.70 cm      LV e' lateral:   14.10 cm/s LVOT diam:     2.10 cm      LV E/e' lateral: 5.1 LV SV:         55 LV SV Index:   29 LVOT Area:     3.46 cm  LV Volumes (MOD) LV vol d, MOD A2C: 101.0 ml LV vol d, MOD A4C: 121.0 ml LV vol s, MOD A2C: 40.1 ml LV vol s, MOD A4C: 47.8 ml LV SV MOD A2C:     60.9 ml LV SV MOD A4C:     121.0 ml LV SV MOD BP:      65.8 ml RIGHT VENTRICLE             IVC RV Basal diam:  3.90 cm  IVC diam: 1.80 cm RV S prime:     11.40 cm/s TAPSE (M-mode):  2.1 cm LEFT ATRIUM           Index        RIGHT ATRIUM           Index LA diam:      3.30 cm 1.73 cm/m   RA Area:     13.20 cm LA Vol (A2C): 30.1 ml 15.73 ml/m  RA Volume:   26.00 ml  13.59 ml/m LA Vol (A4C): 27.1 ml 14.17 ml/m  AORTIC VALVE LVOT Vmax:   84.60 cm/s LVOT Vmean:  56.500 cm/s LVOT VTI:    0.158 m  AORTA Ao Root diam: 2.70 cm MITRAL VALVE MV Area (PHT): 3.37 cm    SHUNTS MV Decel Time: 225 msec    Systemic VTI:  0.16 m MV E velocity: 71.80 cm/s  Systemic Diam: 2.10 cm MV A velocity: 83.80 cm/s MV E/A ratio:  0.86 Arvilla Meres MD Electronically signed by Arvilla Meres MD Signature Date/Time: 07/17/2022/1:27:06 PM    Final    CT ANGIO HEAD NECK W WO CM  Result Date: 07/17/2022 CLINICAL DATA:  Stroke/TIA, determine embolic source EXAM: CT ANGIOGRAPHY HEAD AND NECK WITH AND WITHOUT CONTRAST TECHNIQUE: Multidetector CT imaging of the head and neck was performed using the standard protocol during bolus administration of intravenous contrast. Multiplanar CT image reconstructions and MIPs were obtained to evaluate the vascular anatomy. Carotid stenosis measurements (when applicable) are obtained utilizing NASCET criteria, using the distal internal carotid diameter as the denominator. RADIATION DOSE REDUCTION: This exam was performed according to the departmental dose-optimization program which includes automated exposure control, adjustment of the mA and/or kV according to patient size and/or use of iterative reconstruction technique. CONTRAST:  75mL OMNIPAQUE IOHEXOL 350 MG/ML SOLN COMPARISON:  None Available. FINDINGS: CT HEAD FINDINGS Brain: Similar small volume of acute subarachnoid hemorrhage described on priors. No evidence of acute large vascular territory infarct, mass lesion, midline shift or hydrocephalus. Vascular: See below. Skull: No acute fracture. Sinuses/Orbits: Right sphenoid sinus frothy secretions. Acute orbital findings. Other: No mastoid effusions. Review of the MIP images  confirms the above findings CTA NECK FINDINGS Aortic arch: Great vessel origins are patent without significant stenosis. Right carotid system: No evidence of dissection, stenosis (50% or greater), or occlusion. Left carotid system: Atherosclerosis at the carotid bifurcation without greater than 50% stenosis. Vertebral arteries: Left dominant. No evidence of dissection, stenosis (50% or greater), or occlusion. Skeleton: No acute fracture. Other neck: No acute abnormality on limited assessment. Upper chest: Visualized lung apices are clear. Review of the MIP images confirms the above findings CTA HEAD FINDINGS Anterior circulation: Bilateral intracranial ICAs, MCAs, and ACAs are patent without proximal hemodynamically significant stenosis. Posterior circulation: Bilateral intradural vertebral arteries, basilar artery and bilateral posterior cerebral arteries are patent without proximal hemodynamically significant stenosis Venous sinuses: Similar dural venous sinus thrombosis, better characterized on recent CTV. Similar dural AV fistula, better characterized on recent MRA. Review of the MIP images confirms the above findings IMPRESSION: 1. No large vessel occlusion or proximal hemodynamically significant stenosis. 2. Similar small volume acute subarachnoid hemorrhage. 3. Similar dural venous sinus thrombosis, better characterized on recent CTV. 4. Similar AV fistula, better characterized on recent MRA. Electronically Signed   By: Feliberto Harts M.D.   On: 07/17/2022 13:13   MR ANGIO HEAD WO CONTRAST  Result Date: 07/16/2022 CLINICAL DATA:  Follow-up brain MRI, rule out AVM. EXAM: MRA HEAD WITHOUT CONTRAST  TECHNIQUE: Angiographic images of the Circle of Willis were acquired using MRA technique without intravenous contrast. COMPARISON:  Brain MRI from earlier today FINDINGS: Hypertrophic left occipital artery with arterial signal at the left transverse sigmoid sinus and also filling the left vein of Lab[place]. No  arterial stenosis, beading, or aneurysm. 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 Electronically Signed   By: Tiburcio Pea M.D.   On: 07/16/2022 21:51    PHYSICAL EXAM  Temp:  [97.5 F (36.4 C)-99.4 F (37.4 C)] 99.4 F (37.4 C) (04/19 1548) Pulse Rate:  [60-100] 100 (04/19 1548) Resp:  [16-21] 18 (04/19 1548) BP: (117-147)/(81-92) 136/88 (04/19 1548) SpO2:  [81 %-100 %] 99 % (04/19 1548) Weight:  [79.6 kg] 79.6 kg (04/18 2017)  General - Well nourished, well developed, in no apparent distress Cardiovascular - Regular rhythm and rate.  Mental Status -  Level of arousal and orientation to time, place, and person were intact. Language including expression, naming, repetition, comprehension was assessed and found intact. Attention span and concentration were normal. Recent and remote memory were intact. Fund of Knowledge was assessed and was intact.  Cranial Nerves II - XII - II - Visual field intact OU. III, IV, VI - Extraocular movements intact. V - Facial sensation intact bilaterally. VII - Facial movement intact bilaterally. VIII - Hearing & vestibular intact bilaterally. X - Palate elevates symmetrically. XI - Chin turning & shoulder shrug intact bilaterally. XII - Tongue protrusion intact.  Motor Strength - The patient's strength was normal in all extremities and pronator drift was absent.  Bulk was normal and fasciculations were absent.   Motor Tone - Muscle tone was assessed at the neck and appendages and was normal.  Sensory - Light touch, temperature/pinprick were assessed and were symmetrical.    Coordination - The patient had normal movements in the hands and feet with no ataxia or dysmetria.  Tremor was absent.  Gait and Station - deferred.  ASSESSMENT/PLAN Donald Galloway is a 57 y.o. male with history of hypertension, hyperlipidemia, and sleep apnea who presented to the ED 07/16/22 for evaluation of  fluctuating levels of altered mental status with onset on 07/15/2022.  The patient states that while he was at work yesterday, he felt that he was having trouble comprehending others as they were speaking to him and at times, he "zoned out" and did not hear their spoken words at all.  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 Linear hyperdensity in the left middle cranial fossa is nonspecific, but could represent a small amount of subarachnoid blood products. 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 Sherlon Handing from IR Plan for DSA on Monday  Hypertension Home meds: lisinopril 20 mg, Stable On lisino 20 and norvasc 5 BP goal<160 Long-term BP goal normotensive  Hyperlipidemia Home meds: Crestor 5 mg LDL 99, goal < 70 On crestor 20 Continue statin at discharge  Other Stroke Risk Factors Obstructive sleep  apnea, on CPAP at home  Hospital day # 1  Gevena Mart DNP, ACNPC-AG  [facility]  ATTENDING NOTE: I reviewed above note and agree with the assessment and plan. Pt was seen and examined.   No family at bedside.  Patient lying in bed, neuro intact.  No focal deficit.  On heparin IV.  CT, CTV, CTA and MRI all confirmed CVST, CTA head and neck and MRI showed occipital AV fistula, could be the cause of CVST, or low possibility of consequences of chronic CVST.  Discussed with IR Dr. Sherlon Handing, plan for cerebral angiogram on Monday with intention to treat.  Continue heparin IV.  Put on Crestor 20.  For detailed assessment and plan, please refer to above/below as I have made changes wherever appropriate.   Marvel Plan, MD PhD Stroke Neurology 07/17/2022 7:46 PM     To contact Stroke Continuity provider, please refer to [email or web address]. After hours, contact General Neurology

## 2022-07-17 NOTE — Plan of Care (Signed)

## 2022-07-17 NOTE — Assessment & Plan Note (Addendum)
BP normal - Continue amlodipine, lisinopril

## 2022-07-17 NOTE — Assessment & Plan Note (Signed)
Continue Crestor 

## 2022-07-17 NOTE — Progress Notes (Signed)
  Progress Note   Patient: Donald Galloway IRJ:[id number] DOB: [birth year] DOA: 07/16/2022     1 DOS: the patient was seen and examined on 07/17/2022       Brief hospital course: Mr. Molina is a 57 y.o. M with HTN, HLD and OSA who presented with 2 days waxing and waning language disturbance and frontal HA.   In the ER, [facility] showed dural sinus thrombosis, confirmed with CT venogram.  MRI showed small SAH.   4/18: Started on heparin, Neurology consulted     Assessment and Plan: * Dural sinus thrombosis Discussed again risk/harm ratio of anticoagulation in setting of concurrent SAH, venous sinus thrombosis. Echo normal, [place] LE negative for DVT.   - Continue heparin gtt - Consult Neurology - Frequent Neuro checks    SAH (subarachnoid hemorrhage) See above  Hyperlipidemia - Continue Crestor  Essential hypertension BP normal - Continue amlodipine, lisinopril          Subjective: Headache very mild, possibly from allergies.  No focal weakness, numbness.  No change in speech, no seizures, no LOC.     Physical Exam: BP 126/82 (BP Location: Left Arm)   Pulse 89   Temp (!) 97.5 F (36.4 C) (Oral)   Resp 16   Ht  (1.702 m)   Wt 79.6 kg   SpO2 99%   BMI 27.49 kg/m   Adult male, lying in bed, interactive and appropriate RRR no murmurs no LE edema RR normal, lungs clear wtihout rales or wheezes Attention normal, affect normal, judgment and insight normal Speech fluent and naming intact.  EOMI.  Face symmetric.  Strength 5/5 and symmetric in all four extremities.  Data Reviewed: LDL 99, CBC normal BMP unremarkable UDS negative A1c normal MRI brain showed dural sinus thrombosis, possible SAH  Family Communication: Patient declined    Disposition: Status is: Inpatient Requires ongoing heparin and montioring.        Author: Alberteen Sam, MD 07/17/2022 2:19 PM  For on call review [email or web address].

## 2022-07-17 NOTE — Progress Notes (Signed)
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  Case approved by Dr. Tommie Sams.  Will plan for procedure Monday as schedule allows.   NPO p MN Monday.  Hold heparin at [facility] Monday AM.   Formal consult to follow.   Loyce Dys, MS RD PA-C

## 2022-07-17 NOTE — Progress Notes (Addendum)
ANTICOAGULATION CONSULT NOTE - Initial Consult  Pharmacy Consult for Heparin infusion Indication:  dural venous thrombosis  Allergies  Allergen Reactions   Zolpidem Tartrate     Leaves him groggy in the AM    Patient Measurements: Height:  (170.2 cm) Weight: 79.6 kg (175 lb 7.8 oz) IBW/kg (Calculated) : 66.1 Heparin Dosing Weight: 80.7 kg (total body weight)  Vital Signs: Temp: 99.4 F (37.4 C) (04/19 1548) Temp Source: Oral (04/19 1548) BP: 136/88 (04/19 1548) Pulse Rate: 100 (04/19 1548)  Labs: Recent Labs    07/16/22 1017 07/16/22 1034 07/16/22 2020 07/17/22 0412 07/17/22 1753  HGB 15.2 15.0  --  15.7  --   HCT 43.4 44.0  --  44.1  --   PLT 281  --   --  273  --   APTT 27  --   --   --   --   LABPROT 12.9  --   --   --   --   INR 1.0  --   --   --   --   HEPARINUNFRC  --   --  0.24* 0.60 0.57  CREATININE 1.07 1.00  --   --   --      Estimated Creatinine Clearance: 82.4 mL/min (by C-G formula based on SCr of 1 mg/dL).   Medical History: Past Medical History:  Diagnosis Date   Herpes simplex    Hyperlipidemia    Hypertension    Insomnia    Palpitations    evaluated by DR Eldridge Dace   Sleep apnea     Medications:  Medications Prior to Admission  Medication Sig Dispense Refill Last Dose   lisinopril (PRINIVIL,ZESTRIL) 40 MG tablet Take 20 mg by mouth daily. Pt stated he takes 1/2 tablet everyday since a year ago.   07/16/2022   loratadine (CLARITIN) 10 MG tablet Take 10 mg by mouth daily as needed for allergies.   07/16/2022   rosuvastatin (CRESTOR) 5 MG tablet Take 5 mg by mouth daily.   07/16/2022   traZODone (DESYREL) 100 MG tablet Take 50 mg by mouth at bedtime. Pt stated he took 1/2 tablet the night of 07/15/22   07/15/2022   amLODipine (NORVASC) 10 MG tablet Take 10 mg by mouth daily. (Patient not taking: Reported on 07/16/2022)   Not Taking   aspirin 81 MG tablet Take 81 mg by mouth daily. (Patient not taking: Reported on 07/16/2022)   Not Taking    valACYclovir (VALTREX) 1000 MG tablet Take 1,000 mg by mouth 2 (two) times daily. (Patient not taking: Reported on 07/16/2022)   Not Taking    Assessment: 57 yo M presents to ED with acute memory loss and aphasia since 07/15/22. CT head c/f dural venous thrombosis. CT venogram shows filling defects in the L transverse sinus, L sigmoid sinus, and in the jugular bulb concerning for thrombosis Pt was not taking any anticoagulation prior to admission. Pharmacy consulted to dose and manage heparin infusion per neuro protocol, no bolus.  4/19 PM update HL 0.57 - supra-subtherapeutic  No issues with line/infusion and no s/sx bleeding per RN  Goal of Therapy:  Heparin level 0.3-0.5 units/ml Monitor platelets by anticoagulation protocol: Yes   Plan:  Decrease heparin infusion to 1000 units/hr Check heparin level in 6 hours  Monitor daily CBC, heparin level, and for s/sx of bleeding  Rexford Maus, PharmD, BCPS 07/17/2022 6:51 PM

## 2022-07-17 NOTE — Assessment & Plan Note (Addendum)
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    Neurology and neuro-interventional radiology consulted.  Started on heparin.    Echo normal, [place] LE negative for DVT.   - Continue heparin gtt - Can follow hypercoagulable work up but likely thrombosis is from AVM - Consult Neurology - Consult neurointerventional radiology, plan for AVM treatment tomorrow morning - To ICU post-procedure (see signout for details of handoff)

## 2022-07-17 NOTE — Progress Notes (Signed)
PT Cancellation Note  Patient Details Name: Donald Galloway MRN: [id number] DOB: Jun 25, 1965   Cancelled Treatment:    Reason Eval/Treat Not Completed: PT screened, no needs identified, will sign off PT orders received, chart reviewed. Pt received in room, ambulating out of bathroom without issue. Pt states "I can stand on one leg" as he does so without LOB. No acute PT needs identified at this time. PT to complete current orders.  Aleda Grana, PT, DPT 07/17/22, 9:43 AM   Sandi Mariscal 07/17/2022, 9:42 AM

## 2022-07-18 ENCOUNTER — Inpatient Hospital Stay (HOSPITAL_COMMUNITY): Payer: 59

## 2022-07-18 DIAGNOSIS — R569 Unspecified convulsions: Secondary | ICD-10-CM

## 2022-07-18 DIAGNOSIS — G08 Intracranial and intraspinal phlebitis and thrombophlebitis: Secondary | ICD-10-CM | POA: Diagnosis not present

## 2022-07-18 LAB — CBC
HCT: 44.4 % (ref 39.0–52.0)
Hemoglobin: 15.6 g/dL (ref 13.0–17.0)
MCH: 30.6 pg (ref 26.0–34.0)
MCHC: 35.1 g/dL (ref 30.0–36.0)
MCV: 87.1 fL (ref 80.0–100.0)
Platelets: 268 10*3/uL (ref 150–400)
RBC: 5.1 MIL/uL (ref 4.22–5.81)
RDW: 11.6 % (ref 11.5–15.5)
WBC: 8.3 10*3/uL (ref 4.0–10.5)
nRBC: 0 % (ref 0.0–0.2)

## 2022-07-18 LAB — CARDIOLIPIN ANTIBODIES, IGG, IGM, IGA
Anticardiolipin IgA: <9 APL U/mL (ref 0–11)
Anticardiolipin IgG: <9 GPL U/mL (ref 0–14)
Anticardiolipin IgM: <9 MPL U/mL (ref 0–12)

## 2022-07-18 LAB — HOMOCYSTEINE: Homocysteine: 9.5 umol/L (ref 0.0–14.5)

## 2022-07-18 LAB — HEPARIN LEVEL (UNFRACTIONATED): Heparin Unfractionated: 0.47 IU/mL (ref 0.30–0.70)

## 2022-07-18 MED ORDER — SENNOSIDES-DOCUSATE SODIUM 8.6-50 MG PO TABS
2.0000 | ORAL_TABLET | Freq: Two times a day (BID) | ORAL | Status: AC
  Administered 2022-07-18 – 2022-07-19 (×2): 2 via ORAL
  Filled 2022-07-18 (×2): qty 2

## 2022-07-18 MED ORDER — POLYETHYLENE GLYCOL 3350 17 G PO PACK
17.0000 g | PACK | Freq: Every day | ORAL | Status: DC | PRN
  Filled 2022-07-18: qty 1

## 2022-07-18 MED ORDER — SODIUM CHLORIDE 0.9 % IV SOLN: INTRAVENOUS | Status: DC

## 2022-07-18 NOTE — Plan of Care (Signed)
  Problem: Education: Goal: Knowledge of disease or condition will improve Outcome: Progressing   Problem: Coping: Goal: Will verbalize positive feelings about self Outcome: Progressing Goal: Will identify appropriate support needs Outcome: Progressing   Problem: Nutrition: Goal: Dietary intake will improve Outcome: Progressing   Problem: Education: Goal: Knowledge of General Education information will improve Description: Including pain rating scale, medication(s)/side effects and non-pharmacologic comfort measures Outcome: Progressing

## 2022-07-18 NOTE — Progress Notes (Addendum)
STROKE TEAM PROGRESS NOTE   INTERVAL HISTORY No family at the bedside.  Patient sitting in bed in no apparent distress on a heparin drip No new neurological events overnight.  Will check routine EEG to evaluate for seizure Neurological exam stable and unchanged Scheduled for a formal cerebral angiogram by NIR on Monday Vital signs stable  Vitals:   07/17/22 2034 07/17/22 2300 07/18/22 0744 07/18/22 1104  BP: 121/83 115/78 126/81 134/86  Pulse: 78 83 87 81  Resp: 18 18 18 20   Temp: 98 F (36.7 C) (!) 97.5 F (36.4 C) 97.7 F (36.5 C) 98.4 F (36.9 C)  TempSrc: Oral Oral Oral Oral  SpO2: 99% 95% 99% 100%  Weight:      Height:       CBC:  Recent Labs  Lab 07/16/22 1017 07/16/22 1034 07/17/22 0412 07/18/22 0118  WBC 5.9  --  7.0 8.3  NEUTROABS 3.4  --   --   --   HGB 15.2   < > 15.7 15.6  HCT 43.4   < > 44.1 44.4  MCV 86.1  --  85.5 87.1  PLT 281  --  273 268   < > = values in this interval not displayed.    Basic Metabolic Panel:  Recent Labs  Lab 07/16/22 1017 07/16/22 1034  NA 137 138  K 4.0 4.1  CL 101 101  CO2 26  --   GLUCOSE 108* 105*  BUN 15 18  CREATININE 1.07 1.00  CALCIUM 9.6  --     Lipid Panel:  Recent Labs  Lab 07/17/22 0412  CHOL 174  TRIG 98  HDL 55  CHOLHDL 3.2  VLDL 20  LDLCALC 99    HgbA1c:  Recent Labs  Lab 07/16/22 2020  HGBA1C 5.2    Urine Drug Screen:  Recent Labs  Lab 07/16/22 2319  LABOPIA NONE DETECTED  COCAINSCRNUR NONE DETECTED  LABBENZ NONE DETECTED  AMPHETMU NONE DETECTED  THCU NONE DETECTED  LABBARB NONE DETECTED     Alcohol Level  Recent Labs  Lab 07/16/22 1017  ETH <10     IMAGING past 24 hours VAS [place] LOWER EXTREMITY VENOUS (DVT)  Result Date: 07/17/2022  Lower Venous DVT Study Patient Name:  GERMAINE SHENKER  Date of Exam:   07/17/2022 Medical Rec #: [id number]       Accession #:    [id number] Date of Birth: 02/03/1966       Patient Gender: M Patient Age:   57 years Exam Location:  [facility] Procedure:      VAS [place] LOWER EXTREMITY VENOUS (DVT) Referring Phys: Marvel Plan --------------------------------------------------------------------------------  Indications: Thrombophilia [331404].  Risk Factors: None identified. Comparison Study: No prior studies. Performing Technologist: Chanda Busing RVT  Examination Guidelines: A complete evaluation includes B-mode imaging, spectral Doppler, color Doppler, and power Doppler as needed of all accessible portions of each vessel. Bilateral testing is considered an integral part of a complete examination. Limited examinations for reoccurring indications may be performed as noted. The reflux portion of the exam is performed with the patient in reverse Trendelenburg.  +---------+---------------+---------+-----------+----------+--------------+ RIGHT    CompressibilityPhasicitySpontaneityPropertiesThrombus Aging +---------+---------------+---------+-----------+----------+--------------+ CFV      Full           Yes      Yes                                 +---------+---------------+---------+-----------+----------+--------------+  SFJ      Full                                                        +---------+---------------+---------+-----------+----------+--------------+ FV Prox  Full                                                        +---------+---------------+---------+-----------+----------+--------------+ FV Mid   Full                                                        +---------+---------------+---------+-----------+----------+--------------+ FV DistalFull                                                        +---------+---------------+---------+-----------+----------+--------------+ PFV      Full                                                        +---------+---------------+---------+-----------+----------+--------------+ POP      Full           Yes      Yes                                  +---------+---------------+---------+-----------+----------+--------------+ PTV      Full                                                        +---------+---------------+---------+-----------+----------+--------------+ PERO     Full                                                        +---------+---------------+---------+-----------+----------+--------------+   +---------+---------------+---------+-----------+----------+--------------+ LEFT     CompressibilityPhasicitySpontaneityPropertiesThrombus Aging +---------+---------------+---------+-----------+----------+--------------+ CFV      Full           Yes      Yes                                 +---------+---------------+---------+-----------+----------+--------------+ SFJ      Full                                                        +---------+---------------+---------+-----------+----------+--------------+  FV Prox  Full                                                        +---------+---------------+---------+-----------+----------+--------------+ FV Mid   Full                                                        +---------+---------------+---------+-----------+----------+--------------+ FV DistalFull                                                        +---------+---------------+---------+-----------+----------+--------------+ PFV      Full                                                        +---------+---------------+---------+-----------+----------+--------------+ POP      Full           Yes      Yes                                 +---------+---------------+---------+-----------+----------+--------------+ PTV      Full                                                        +---------+---------------+---------+-----------+----------+--------------+ PERO     Full                                                         +---------+---------------+---------+-----------+----------+--------------+     Summary: RIGHT: - There is no evidence of deep vein thrombosis in the lower extremity.  - No cystic structure found in the popliteal fossa.  LEFT: - There is no evidence of deep vein thrombosis in the lower extremity.  - No cystic structure found in the popliteal fossa.  *See table(s) above for measurements and observations. Electronically signed by Sherald Hess MD on 07/17/2022 at 3:14:38 PM.    Final     PHYSICAL EXAM  Temp:  [97.5 F (36.4 C)-99.4 F (37.4 C)] 98.4 F (36.9 C) (04/20 1104) Pulse Rate:  [78-100] 81 (04/20 1104) Resp:  [18-20] 20 (04/20 1104) BP: (115-136)/(78-88) 134/86 (04/20 1104) SpO2:  [95 %-100 %] 100 % (04/20 1104)  General - Well nourished, well developed pleasant middle-age male, in no apparent distress Cardiovascular - Regular rhythm and rate.  Mental Status -  Level of arousal and orientation to time, place, and person were intact. Language including expression, naming, repetition, comprehension was assessed and found intact. Attention  span and concentration were normal. Recent and remote memory were intact. Fund of Knowledge was assessed and was intact.  Cranial Nerves II - XII - II - Visual field intact OU. III, IV, VI - Extraocular movements intact. V - Facial sensation intact bilaterally. VII - Facial movement intact bilaterally. VIII - Hearing & vestibular intact bilaterally. X - Palate elevates symmetrically. XI - Chin turning & shoulder shrug intact bilaterally. XII - Tongue protrusion intact.  Motor Strength - The patient's strength was normal in all extremities and pronator drift was absent.  Bulk was normal and fasciculations were absent.   Motor Tone - Muscle tone was assessed at the neck and appendages and was normal.  Sensory - Light touch, temperature/pinprick were assessed and were symmetrical.    Coordination - The patient had normal movements in the  hands and feet with no ataxia or dysmetria.  Tremor was absent.  Gait and Station - deferred.  ASSESSMENT/PLAN Mr. OTHELLO DICKENSON is a 57 y.o. male with history of hypertension, hyperlipidemia, and sleep apnea who presented to the ED 07/16/22 for evaluation of fluctuating levels of altered mental status with onset on 07/15/2022.  The patient states that while he was at work yesterday, he felt that he was having trouble comprehending others as they were speaking to him and at times, he "zoned out" and did not hear their spoken words at all.  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 Linear hyperdensity in the left middle cranial fossa is nonspecific, but could represent a small amount of subarachnoid blood products. 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 Sherlon Handing from IR Plan for  DSA on Monday  Hypertension Home meds: lisinopril 20 mg, Stable On lisino 20 and norvasc 5 BP goal<160 Long-term BP goal normotensive  Hyperlipidemia Home meds: Crestor 5 mg LDL 99, goal < 70 On crestor 20 Continue statin at discharge  Other Stroke Risk Factors Obstructive sleep apnea, on CPAP at home  Hospital day # 2  Gevena Mart DNP, ACNPC-AG  [facility]  I have personally obtained history,examined this patient, reviewed notes, independently viewed imaging studies, participated in medical decision making and plan of care.ROS completed by me personally and pertinent positives fully documented  I have made any additions or clarifications directly to the above note. Agree with note above.  Patient presented with transient speech and word finding difficulties 2 separate episodes 2 days apart with mild headache.  CT scanning of  the head and MRI confirmed left transverse sinus, sigmoid sinus dural venous sinus thrombosis.  There is a questionable tiny hyperdensity on the CT in the left anterior temporal lobe raising concern for subarachnoid hemorrhage.  Diagnostic cerebral catheter angiogram is pending for Monday.  Continue anticoagulation with IV heparin and then switch to oral anticoagulation after the angiogram.  Maintain aggressive hydration.  Follow results of hypercoagulable panel.  No obvious provoking trigger noted in patient's history and lab work.  Long discussion with patient and answered questions.  Discussed with Dr. Maryfrances Bunnell.  Greater than 50% time during this 35-minute visit was spent in counseling and coordination of care about cerebral venous sinus thrombosis and discussion about evaluation and treatment and answering questions.  Delia Heady, MD Medical Director [facility] Pager: [phone] 07/18/2022 2:08 PM   To contact Stroke Continuity provider, please refer to [email or web address]. After hours, contact General Neurology

## 2022-07-18 NOTE — Progress Notes (Signed)
ANTICOAGULATION CONSULT NOTE  Pharmacy Consult for Heparin infusion Indication:  dural venous thrombosis  Allergies  Allergen Reactions   Zolpidem Tartrate     Leaves him groggy in the AM    Patient Measurements: Height:  (170.2 cm) Weight: 79.6 kg (175 lb 7.8 oz) IBW/kg (Calculated) : 66.1 Heparin Dosing Weight: 80.7 kg (total body weight)  Vital Signs: Temp: 97.5 F (36.4 C) (04/19 2300) Temp Source: Oral (04/19 2300) BP: 115/78 (04/19 2300) Pulse Rate: 83 (04/19 2300)  Labs: Recent Labs    07/16/22 1017 07/16/22 1034 07/16/22 2020 07/17/22 0412 07/17/22 1753 07/18/22 0118  HGB 15.2 15.0  --  15.7  --  15.6  HCT 43.4 44.0  --  44.1  --  44.4  PLT 281  --   --  273  --  268  APTT 27  --   --   --   --   --   LABPROT 12.9  --   --   --   --   --   INR 1.0  --   --   --   --   --   HEPARINUNFRC  --   --    < > 0.60 0.57 0.47  CREATININE 1.07 1.00  --   --   --   --    < > = values in this interval not displayed.     Estimated Creatinine Clearance: 82.4 mL/min (by C-G formula based on SCr of 1 mg/dL).   Medical History: Past Medical History:  Diagnosis Date   Herpes simplex    Hyperlipidemia    Hypertension    Insomnia    Palpitations    evaluated by DR Eldridge Dace   Sleep apnea     Medications:  Medications Prior to Admission  Medication Sig Dispense Refill Last Dose   lisinopril (PRINIVIL,ZESTRIL) 40 MG tablet Take 20 mg by mouth daily. Pt stated he takes 1/2 tablet everyday since a year ago.   07/16/2022   loratadine (CLARITIN) 10 MG tablet Take 10 mg by mouth daily as needed for allergies.   07/16/2022   rosuvastatin (CRESTOR) 5 MG tablet Take 5 mg by mouth daily.   07/16/2022   traZODone (DESYREL) 100 MG tablet Take 50 mg by mouth at bedtime. Pt stated he took 1/2 tablet the night of 07/15/22   07/15/2022   amLODipine (NORVASC) 10 MG tablet Take 10 mg by mouth daily. (Patient not taking: Reported on 07/16/2022)   Not Taking   aspirin 81 MG tablet  Take 81 mg by mouth daily. (Patient not taking: Reported on 07/16/2022)   Not Taking   valACYclovir (VALTREX) 1000 MG tablet Take 1,000 mg by mouth 2 (two) times daily. (Patient not taking: Reported on 07/16/2022)   Not Taking    Assessment: 57 yo M presents to ED with acute memory loss and aphasia since 07/15/22. CT head c/f dural venous thrombosis. CT venogram shows filling defects in the L transverse sinus, L sigmoid sinus, and in the jugular bulb concerning for thrombosis Pt was not taking any anticoagulation prior to admission. Pharmacy consulted to dose and manage heparin infusion per neuro protocol, no bolus.  Heparin level currently at goal at 0.47.  No overt bleeding or complications noted.  Goal of Therapy:  Heparin level 0.3-0.5 units/ml Monitor platelets by anticoagulation protocol: Yes   Plan:  Continue IV heparin at current rate. Monitor daily CBC, heparin level, and for s/sx of bleeding   Reece Leader, Pharm D,  BCPS, BCCP Clinical Pharmacist  07/18/2022 2:14 AM   [facility] pharmacy phone numbers are listed on [email or web address]

## 2022-07-18 NOTE — Progress Notes (Signed)
  Progress Note   Patient: NETANEL YANNUZZI ZOX:[id number] DOB: 11-May-1965 DOA: 07/16/2022     2 DOS: the patient was seen and examined on 07/18/2022       Brief hospital course: Mr. Imran is a 57 y.o. M with HTN, HLD and OSA who presented with 2 days waxing and waning language disturbance and frontal HA.   In the ER, [facility] showed dural sinus thrombosis, confirmed with CT venogram.   4/18: Started on heparin, Neurology consulted     Assessment and Plan: * Dural sinus thrombosis   - Continue heparin gtt - Consult Neurology - Frequent Neuro checks     Hyperlipidemia - Continue Crestor  Essential hypertension BP normal - Continue amlodipine, lisinopril          Subjective: He has no complaints, nursing no concerns     Physical Exam: BP (!) 141/78 (BP Location: Left Arm)   Pulse 88   Temp 98.3 F (36.8 C) (Oral)   Resp 20   Ht  (1.702 m)   Wt 79.6 kg   SpO2 99%   BMI 27.49 kg/m   Adult male, sitting up, reading Interactive and appropriate  Data Reviewed: CBC unremarkable  Family Communication: None present    Disposition: Status is: Inpatient Stable awaiting procedure on Monday then home        Author: Alberteen Sam, MD 07/18/2022 5:45 PM  For on call review [email or web address].

## 2022-07-18 NOTE — Progress Notes (Signed)
EEG complete - results pending 

## 2022-07-18 NOTE — Consult Note (Signed)
Chief Complaint: Dural AV Fistula   Referring Physician(s): Marvel Plan  Supervising Physician: Baldemar Lenis  Patient Status: [facility] - In-pt  History of Present Illness: Donald Galloway is a 57 y.o. male who woke up on 07/15/22 with headache mostly on his right forehead.  While at work he developed sudden onset of confusion and slurred speech.   Per chart, the symptoms lasted about 2 to 3 hours and resolved.  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   CTA showed filling defect in the left transverse sinus/sigmoid sinus and left jugular bulb concerning for thrombosis.    MRI of the brain showed abnormal signals in the left transverse and sigmoid sinus and left jugular bulb compatible with dural venous sinus thrombosis and small focus of circular FLAIR hyperintensity compatible with small volume acute SAH.  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  He  denies any weakness or numbness of his limbs, no change in vision, no hearing loss.  NIR is asked to consult on patient for cerebral angiography.   Past Medical History:  Diagnosis Date   Herpes simplex    Hyperlipidemia    Hypertension    Insomnia    Palpitations    evaluated by DR Eldridge Dace   Sleep apnea     Past Surgical History:  Procedure Laterality Date   NOSE SURGERY     DR Annalee Genta    Allergies: Zolpidem tartrate  Medications: Prior to Admission medications   Medication Sig Start Date End Date Taking? Authorizing Provider  lisinopril (PRINIVIL,ZESTRIL) 40 MG tablet Take 20 mg by mouth daily. Pt stated he takes 1/2 tablet everyday since a year ago.   Yes [provider]  loratadine (CLARITIN) 10 MG tablet Take 10 mg by mouth daily as needed for allergies.   Yes [provider]  rosuvastatin (CRESTOR) 5 MG tablet Take 5 mg  by mouth daily.   Yes [provider]  traZODone (DESYREL) 100 MG tablet Take 50 mg by mouth at bedtime. Pt stated he took 1/2 tablet the night of 07/15/22   Yes [provider]  amLODipine (NORVASC) 10 MG tablet Take 10 mg by mouth daily. Patient not taking: Reported on 07/16/2022    [provider]  aspirin 81 MG tablet Take 81 mg by mouth daily. Patient not taking: Reported on 07/16/2022    [provider]  valACYclovir (VALTREX) 1000 MG tablet Take 1,000 mg by mouth 2 (two) times daily. Patient not taking: Reported on 07/16/2022    [provider]     Family History  Problem Relation Age of Onset   Hyperlipidemia Mother    Hyperlipidemia Father    Hypertension Father     Social History   Socioeconomic History   Marital status: Single    Spouse name: Not on file   Number of children: Not on file   Years of education: Not on file   Highest education level: Not on file  Occupational History   Not on file  Tobacco Use   Smoking status: Never   Smokeless tobacco: Not on file  Substance and Sexual Activity   Alcohol use: Yes   Drug use: Never   Sexual activity: Not on file  Other Topics Concern   Not on file  Social History Narrative   Not on file   Social Determinants of  Health   Financial Resource Strain: Not on file  Food Insecurity: No Food Insecurity (07/16/2022)   Hunger Vital Sign    Worried About Running Out of Food in the Last Year: Never true    Ran Out of Food in the Last Year: Never true  Transportation Needs: No Transportation Needs (07/16/2022)   PRAPARE - Administrator, Civil Service (Medical): No    Lack of Transportation (Non-Medical): No  Physical Activity: Not on file  Stress: Not on file  Social Connections: Not on file    Review of Systems: A 12 point ROS discussed and pertinent positives are indicated in the HPI above.  All other systems are negative.  Review of Systems  Constitutional:   Negative for appetite change and fatigue.  Respiratory:  Negative for cough and shortness of breath.   Cardiovascular:  Negative for chest pain and leg swelling.  Gastrointestinal:  Negative for abdominal pain, diarrhea, nausea and vomiting.  Musculoskeletal:  Negative for back pain.  Neurological:  Negative for dizziness, facial asymmetry, weakness and headaches.    Vital Signs: BP 134/86 (BP Location: Right Arm)   Pulse 81   Temp 98.4 F (36.9 C) (Oral)   Resp 20   Ht 5\' 7"  (1.702 m)   Wt 175 lb 7.8 oz (79.6 kg)   SpO2 100%   BMI 27.49 kg/m     Physical Exam Vitals reviewed.  Constitutional:      Appearance: Normal appearance.  HENT:     Head: Normocephalic and atraumatic.  Eyes:     General: No visual field deficit.    Extraocular Movements: Extraocular movements intact.  Cardiovascular:     Rate and Rhythm: Normal rate and regular rhythm.  Pulmonary:     Effort: Pulmonary effort is normal. No respiratory distress.     Breath sounds: Normal breath sounds.  Abdominal:     General: There is no distension.     Palpations: Abdomen is soft.     Tenderness: There is no abdominal tenderness.  Musculoskeletal:        General: Normal range of motion.     Cervical back: Normal range of motion.  Skin:    General: Skin is warm and dry.  Neurological:     General: No focal deficit present.     Mental Status: He is alert and oriented to person, place, and time.     Cranial Nerves: No dysarthria or facial asymmetry.     Motor: No weakness.     Coordination: Coordination normal.  Psychiatric:        Mood and Affect: Mood normal.        Behavior: Behavior normal.        Thought Content: Thought content normal.        Judgment: Judgment normal.     Imaging: VAS [place] LOWER EXTREMITY VENOUS (DVT)  Result Date: 07/17/2022  Lower Venous DVT Study Patient Name:  MATTHEWJAMES PETRASEK  Date of Exam:   07/17/2022 Medical Rec #: [id number]       Accession #:    [id number] Date of Birth:  05/10/1965       Patient Gender: M Patient Age:   35 years Exam Location:  [facility] Procedure:      VAS [place] LOWER EXTREMITY VENOUS (DVT) Referring Phys: Marvel Plan --------------------------------------------------------------------------------  Indications: Thrombophilia [331404].  Risk Factors: None identified. Comparison Study: No prior studies. Performing Technologist: Chanda Busing RVT  Examination Guidelines: A complete evaluation includes  B-mode imaging, spectral Doppler, color Doppler, and power Doppler as needed of all accessible portions of each vessel. Bilateral testing is considered an integral part of a complete examination. Limited examinations for reoccurring indications may be performed as noted. The reflux portion of the exam is performed with the patient in reverse Trendelenburg.  +---------+---------------+---------+-----------+----------+--------------+ RIGHT    CompressibilityPhasicitySpontaneityPropertiesThrombus Aging +---------+---------------+---------+-----------+----------+--------------+ CFV      Full           Yes      Yes                                 +---------+---------------+---------+-----------+----------+--------------+ SFJ      Full                                                        +---------+---------------+---------+-----------+----------+--------------+ FV Prox  Full                                                        +---------+---------------+---------+-----------+----------+--------------+ FV Mid   Full                                                        +---------+---------------+---------+-----------+----------+--------------+ FV DistalFull                                                        +---------+---------------+---------+-----------+----------+--------------+ PFV      Full                                                         +---------+---------------+---------+-----------+----------+--------------+ POP      Full           Yes      Yes                                 +---------+---------------+---------+-----------+----------+--------------+ PTV      Full                                                        +---------+---------------+---------+-----------+----------+--------------+ PERO     Full                                                        +---------+---------------+---------+-----------+----------+--------------+   +---------+---------------+---------+-----------+----------+--------------+  LEFT     CompressibilityPhasicitySpontaneityPropertiesThrombus Aging +---------+---------------+---------+-----------+----------+--------------+ CFV      Full           Yes      Yes                                 +---------+---------------+---------+-----------+----------+--------------+ SFJ      Full                                                        +---------+---------------+---------+-----------+----------+--------------+ FV Prox  Full                                                        +---------+---------------+---------+-----------+----------+--------------+ FV Mid   Full                                                        +---------+---------------+---------+-----------+----------+--------------+ FV DistalFull                                                        +---------+---------------+---------+-----------+----------+--------------+ PFV      Full                                                        +---------+---------------+---------+-----------+----------+--------------+ POP      Full           Yes      Yes                                 +---------+---------------+---------+-----------+----------+--------------+ PTV      Full                                                         +---------+---------------+---------+-----------+----------+--------------+ PERO     Full                                                        +---------+---------------+---------+-----------+----------+--------------+     Summary: RIGHT: - There is no evidence of deep vein thrombosis in the lower extremity.  - No cystic structure found in the popliteal fossa.  LEFT: - There is no evidence of deep vein thrombosis in the lower extremity.  - No  cystic structure found in the popliteal fossa.  *See table(s) above for measurements and observations. Electronically signed by Sherald Hess MD on 07/17/2022 at 3:14:38 PM.    Final    ECHOCARDIOGRAM COMPLETE  Result Date: 07/17/2022    ECHOCARDIOGRAM REPORT   Patient Name:   DARRILL VREELAND Date of Exam: 07/17/2022 Medical Rec #:  [id number]      Height:       67.0 in Accession #:    [id number]     Weight:       175.5 lb Date of Birth:  1965/11/22      BSA:          1.913 m Patient Age:    56 years       BP:           129/88 mmHg Patient Gender: M              HR:           86 bpm. Exam Location:  Inpatient Procedure: 2D Echo, Cardiac Doppler and Color Doppler Indications:    TIA  History:        Patient has no prior history of Echocardiogram examinations.                 Risk Factors:Hypertension and Dyslipidemia.  Sonographer:    Mike Gip Referring Phys: [id number] PING T ZHANG IMPRESSIONS  1. Left ventricular ejection fraction, by estimation, is 60 to 65%. The left ventricle has normal function. The left ventricle has no regional wall motion abnormalities. Left ventricular diastolic parameters were normal.  2. Right ventricular systolic function is normal. The right ventricular size is normal.  3. The mitral valve is normal in structure. Trivial mitral valve regurgitation. No evidence of mitral stenosis.  4. The aortic valve was not well visualized. Aortic valve regurgitation is not visualized. No aortic stenosis is present.  5. The inferior vena cava is  normal in size with greater than 50% respiratory variability, suggesting right atrial pressure of 3 mmHg. FINDINGS  Left Ventricle: Left ventricular ejection fraction, by estimation, is 60 to 65%. The left ventricle has normal function. The left ventricle has no regional wall motion abnormalities. The left ventricular internal cavity size was normal in size. There is  no left ventricular hypertrophy. Left ventricular diastolic parameters were normal. Right Ventricle: The right ventricular size is normal. No increase in right ventricular wall thickness. Right ventricular systolic function is normal. Left Atrium: Left atrial size was normal in size. Right Atrium: Right atrial size was normal in size. Pericardium: There is no evidence of pericardial effusion. Mitral Valve: The mitral valve is normal in structure. Trivial mitral valve regurgitation. No evidence of mitral valve stenosis. Tricuspid Valve: The tricuspid valve is normal in structure. Tricuspid valve regurgitation is trivial. No evidence of tricuspid stenosis. Aortic Valve: The aortic valve was not well visualized. Aortic valve regurgitation is not visualized. No aortic stenosis is present. Pulmonic Valve: The pulmonic valve was normal in structure. Pulmonic valve regurgitation is not visualized. No evidence of pulmonic stenosis. Aorta: The aortic root is normal in size and structure. Venous: The inferior vena cava is normal in size with greater than 50% respiratory variability, suggesting right atrial pressure of 3 mmHg. IAS/Shunts: No atrial level shunt detected by color flow Doppler.  LEFT VENTRICLE PLAX 2D LVIDd:         4.80 cm      Diastology LVIDs:         3.20  cm      LV e' medial:    7.62 cm/s LV PW:         0.60 cm      LV E/e' medial:  9.4 LV IVS:        0.70 cm      LV e' lateral:   14.10 cm/s LVOT diam:     2.10 cm      LV E/e' lateral: 5.1 LV SV:         55 LV SV Index:   29 LVOT Area:     3.46 cm  LV Volumes (MOD) LV vol d, MOD A2C: 101.0 ml  LV vol d, MOD A4C: 121.0 ml LV vol s, MOD A2C: 40.1 ml LV vol s, MOD A4C: 47.8 ml LV SV MOD A2C:     60.9 ml LV SV MOD A4C:     121.0 ml LV SV MOD BP:      65.8 ml RIGHT VENTRICLE             IVC RV Basal diam:  3.90 cm     IVC diam: 1.80 cm RV S prime:     11.40 cm/s TAPSE (M-mode): 2.1 cm LEFT ATRIUM           Index        RIGHT ATRIUM           Index LA diam:      3.30 cm 1.73 cm/m   RA Area:     13.20 cm LA Vol (A2C): 30.1 ml 15.73 ml/m  RA Volume:   26.00 ml  13.59 ml/m LA Vol (A4C): 27.1 ml 14.17 ml/m  AORTIC VALVE LVOT Vmax:   84.60 cm/s LVOT Vmean:  56.500 cm/s LVOT VTI:    0.158 m  AORTA Ao Root diam: 2.70 cm MITRAL VALVE MV Area (PHT): 3.37 cm    SHUNTS MV Decel Time: 225 msec    Systemic VTI:  0.16 m MV E velocity: 71.80 cm/s  Systemic Diam: 2.10 cm MV A velocity: 83.80 cm/s MV E/A ratio:  0.86 Arvilla Meres MD Electronically signed by Arvilla Meres MD Signature Date/Time: 07/17/2022/1:27:06 PM    Final    CT ANGIO HEAD NECK W WO CM  Result Date: 07/17/2022 CLINICAL DATA:  Stroke/TIA, determine embolic source EXAM: CT ANGIOGRAPHY HEAD AND NECK WITH AND WITHOUT CONTRAST TECHNIQUE: Multidetector CT imaging of the head and neck was performed using the standard protocol during bolus administration of intravenous contrast. Multiplanar CT image reconstructions and MIPs were obtained to evaluate the vascular anatomy. Carotid stenosis measurements (when applicable) are obtained utilizing NASCET criteria, using the distal internal carotid diameter as the denominator. RADIATION DOSE REDUCTION: This exam was performed according to the departmental dose-optimization program which includes automated exposure control, adjustment of the mA and/or kV according to patient size and/or use of iterative reconstruction technique. CONTRAST:  75mL OMNIPAQUE IOHEXOL 350 MG/ML SOLN COMPARISON:  None Available. FINDINGS: CT HEAD FINDINGS Brain: Similar small volume of acute subarachnoid hemorrhage described on  priors. No evidence of acute large vascular territory infarct, mass lesion, midline shift or hydrocephalus. Vascular: See below. Skull: No acute fracture. Sinuses/Orbits: Right sphenoid sinus frothy secretions. Acute orbital findings. Other: No mastoid effusions. Review of the MIP images confirms the above findings CTA NECK FINDINGS Aortic arch: Great vessel origins are patent without significant stenosis. Right carotid system: No evidence of dissection, stenosis (50% or greater), or occlusion. Left carotid system: Atherosclerosis at the carotid bifurcation without greater than  50% stenosis. Vertebral arteries: Left dominant. No evidence of dissection, stenosis (50% or greater), or occlusion. Skeleton: No acute fracture. Other neck: No acute abnormality on limited assessment. Upper chest: Visualized lung apices are clear. Review of the MIP images confirms the above findings CTA HEAD FINDINGS Anterior circulation: Bilateral intracranial ICAs, MCAs, and ACAs are patent without proximal hemodynamically significant stenosis. Posterior circulation: Bilateral intradural vertebral arteries, basilar artery and bilateral posterior cerebral arteries are patent without proximal hemodynamically significant stenosis Venous sinuses: Similar dural venous sinus thrombosis, better characterized on recent CTV. Similar dural AV fistula, better characterized on recent MRA. Review of the MIP images confirms the above findings IMPRESSION: 1. No large vessel occlusion or proximal hemodynamically significant stenosis. 2. Similar small volume acute subarachnoid hemorrhage. 3. Similar dural venous sinus thrombosis, better characterized on recent CTV. 4. Similar AV fistula, better characterized on recent MRA. Electronically Signed   By: Feliberto Harts M.D.   On: 07/17/2022 13:13   MR ANGIO HEAD WO CONTRAST  Result Date: 07/16/2022 CLINICAL DATA:  Follow-up brain MRI, rule out AVM. EXAM: MRA HEAD WITHOUT CONTRAST TECHNIQUE: Angiographic  images of the Circle of Willis were acquired using MRA technique without intravenous contrast. COMPARISON:  Brain MRI from earlier today FINDINGS: Hypertrophic left occipital artery with arterial signal at the left transverse sigmoid sinus and also filling the left vein of Labbe. No arterial stenosis, beading, or aneurysm. 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 Electronically Signed   By: Tiburcio Pea M.D.   On: 07/16/2022 21:51   MR BRAIN WO CONTRAST  Result Date: 07/16/2022 CLINICAL DATA:  Transient ischemic attack (TIA) EXAM: MRI HEAD WITHOUT CONTRAST TECHNIQUE: Multiplanar, multiecho pulse sequences of the brain and surrounding structures were obtained without intravenous contrast. COMPARISON:  None Available. FINDINGS: Brain: No acute infarction, hydrocephalus, extra-axial collection or mass lesion. Small focus of sulcal FLAIR hyperintensity in the posterior aspect of the left sylvian fissure is compatible with small volume acute subarachnoid hemorrhage. On susceptibility weighted imaging prominent serpiginous vessels in the left cerebellum, left parieto-occipital and posterior temporal regions. Vascular: Abnormal signal in the left transverse and sigmoid sinuses and left jugular bulb. Major arterial flow voids are maintained at the skull base. Skull and upper cervical spine: Normal marrow signal. Sinuses/Orbits: Negative. IMPRESSION: 1. Abnormal signal in the left transverse and sigmoid sinuses and left jugular bulb, compatible with dural venous sinus thrombosis seen on same day CT venogram. No evidence of adjacent acute infarct. 2. Small focus of sulcal FLAIR hyperintensity in the posterior aspect of the left sylvian fissure is compatible with small volume acute subarachnoid hemorrhage. 3. On susceptibility weighted imaging prominent serpiginous vessels in the left cerebellum, left parieto-occipital and posterior temporal regions (adjacent to the  thrombosed sinuses). This probably represents venous congestion due to the dural venous thrombosis described above, but MRA is recommended to exclude dural AV fistula or AVM as a cause of the patient's sinus thrombosis. Electronically Signed   By: Feliberto Harts M.D.   On: 07/16/2022 16:09   CT VENOGRAM HEAD  Result Date: 07/16/2022 CLINICAL DATA:  Dural venous sinus thrombosis suspected EXAM: CT VENOGRAM HEAD TECHNIQUE: Venographic phase images of the brain were obtained following the administration of intravenous contrast. Multiplanar reformats and maximum intensity projections were generated. RADIATION DOSE REDUCTION: This exam was performed according to the departmental dose-optimization program which includes automated exposure control, adjustment of the mA and/or kV according to patient size and/or use of iterative reconstruction technique.  CONTRAST:  75mL OMNIPAQUE IOHEXOL 350 MG/ML SOLN COMPARISON:  No prior CT venogram available, correlation is made with CT head 07/16/2022 10:40 a.m. FINDINGS: Filling defect in the left transverse sinus (series 3, images 12-13 and series 7, images 169-22), left sigmoid sinus (series 7, images 137-151 and series 3, images 5-9), and in the jugular bulb (series 3, images 2-4 and series 7, images 124-130). Otherwise patent venous sinuses. IMPRESSION: Filling defect in the left transverse sinus, left sigmoid sinus, and left jugular bulb, concerning for thrombosis. These results were called by telephone at the time of interpretation on 07/16/2022 at 2:22 pm to provider [facility] RAY , who verbally acknowledged these results. Electronically Signed   By: Wiliam Ke M.D.   On: 07/16/2022 14:24   CT HEAD WO CONTRAST  Result Date: 07/16/2022 CLINICAL DATA:  Altered mental status EXAM: CT HEAD WITHOUT CONTRAST TECHNIQUE: Contiguous axial images were obtained from the base of the skull through the vertex without intravenous contrast. RADIATION DOSE REDUCTION: This exam was  performed according to the departmental dose-optimization program which includes automated exposure control, adjustment of the mA and/or kV according to patient size and/or use of iterative reconstruction technique. COMPARISON:  None Available. FINDINGS: Brain: No evidence of acute infarction, hydrocephalus, or mass lesion/mass effect. Linear hyperdensity in the left middle cranial fossa is nonspecific, but could represent a small amount of subarachnoid blood products (series 3, image 10). Vascular: Asymmetrically hyperdense appearance of the left sigmoid/transverse sinus junction (series 3, image 13). Skull: Normal. Negative for fracture or focal lesion. Sinuses/Orbits: No middle ear or mastoid effusion. Frothy secretions in the right sphenoid sinus, which can be seen in the setting of acute sinusitis. Orbits are unremarkable. Other: None. IMPRESSION: 1. Asymmetrically hyperdense appearance of the left sigmoid/transverse sinus junction, concerning for dural venous sinus thrombosis. Recommend further evaluation with a CT venogram. 2. Linear hyperdensity in the left middle cranial fossa is nonspecific, but could represent a small amount of subarachnoid blood products. 3. Frothy secretions in the right sphenoid sinus, which can be seen in the setting of acute sinusitis. Electronically Signed   By: Lorenza Cambridge M.D.   On: 07/16/2022 11:13    Labs:  CBC: Recent Labs    07/16/22 1017 07/16/22 1034 07/17/22 0412 07/18/22 0118  WBC 5.9  --  7.0 8.3  HGB 15.2 15.0 15.7 15.6  HCT 43.4 44.0 44.1 44.4  PLT 281  --  273 268    COAGS: Recent Labs    07/16/22 1017  INR 1.0  APTT 27    BMP: Recent Labs    07/16/22 1017 07/16/22 1034  NA 137 138  K 4.0 4.1  CL 101 101  CO2 26  --   GLUCOSE 108* 105*  BUN 15 18  CALCIUM 9.6  --   CREATININE 1.07 1.00  GFRNONAA >60  --     LIVER FUNCTION TESTS: Recent Labs    07/16/22 1017  BILITOT 1.0  AST 26  ALT 37  ALKPHOS 48  PROT 7.1  ALBUMIN  4.3    TUMOR MARKERS: No results for input(s): "AFPTM", "CEA", "CA199", "CHROMGRNA" in the last 8760 hours.  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  Will plan for cerebral angiogram for dural AV fistula with associated  venous thrombosis by Dr. Tommie Sams on Monday as schedule allows.    NPO p MN Sunday night Hold heparin at 0630 Monday AM.   Risks and  benefits of cerebral angiogram with intervention were discussed with the patient including, but not limited to bleeding, infection, vascular injury, contrast induced renal failure, stroke or even death.  This interventional procedure involves the use of X-rays and because of the nature of the planned procedure, it is possible that we will have prolonged use of X-ray fluoroscopy.  Potential radiation risks to you include (but are not limited to) the following: - A slightly elevated risk for cancer  several years later in life. This risk is typically less than 0.5% percent. This risk is low in comparison to the normal incidence of human cancer, which is 33% for women and 50% for men according to the American [employer]. - Radiation induced injury can include skin redness, resembling a rash, tissue breakdown / ulcers and hair loss (which can be temporary or permanent).   The likelihood of either of these occurring depends on the difficulty of the procedure and whether you are sensitive to radiation due to previous procedures, disease, or genetic conditions.   IF your procedure requires a prolonged use of radiation, you will be notified and given written instructions for further action.  It is your responsibility to monitor the irradiated area for the 2 weeks following the procedure and to notify your physician if you are concerned that you have suffered a radiation induced injury.    All of the patient's questions were answered, patient is agreeable to  proceed.  Consent signed and in chart.  Thank you for this interesting consult.  I greatly enjoyed meeting RASHAD AULD and look forward to participating in their care.  A copy of this report was sent to the requesting provider on this date.  Electronically Signed:     I spent a total of 40 Minutes in face to face in clinical consultation, greater than 50% of which was counseling/coordinating care for cerebral angiography.

## 2022-07-18 NOTE — Procedures (Signed)
History: 57 yo male with dural venous sinus thrombosis being evaluated for possible seizure  Sedation: None  Technique: This EEG was acquired with electrodes placed according to the International 10-20 electrode system (including Fp1, Fp2, F3, F4, C3, C4, P3, P4, O1, O2, T3, T4, T5, T6, A1, A2, Fz, Cz, Pz). The following electrodes were missing or displaced: none.   Background: The background consists of intermixed alpha and beta activities. There is a well defined posterior dominant rhythm of 9-10 hz that attenuates with eye opening. Sleep is very briefly recorded.  Photic stimulation: Physiologic driving is present  EEG Abnormalities: None  Clinical Interpretation: This normal EEG is recorded in the waking and sleep state. There was no seizure or seizure predisposition recorded on this study. Please note that lack of epileptiform activity on EEG does not preclude the possibility of epilepsy.   Ritta Slot, MD [facility] [phone]  If 7pm- 7am, please page neurology on call as listed in AMION.

## 2022-07-19 DIAGNOSIS — G08 Intracranial and intraspinal phlebitis and thrombophlebitis: Secondary | ICD-10-CM | POA: Diagnosis not present

## 2022-07-19 LAB — CBC
HCT: 39.6 % (ref 39.0–52.0)
Hemoglobin: 13.6 g/dL (ref 13.0–17.0)
MCH: 30.1 pg (ref 26.0–34.0)
MCHC: 34.3 g/dL (ref 30.0–36.0)
MCV: 87.6 fL (ref 80.0–100.0)
Platelets: 234 10*3/uL (ref 150–400)
RBC: 4.52 MIL/uL (ref 4.22–5.81)
RDW: 11.5 % (ref 11.5–15.5)
WBC: 6.8 10*3/uL (ref 4.0–10.5)
nRBC: 0 % (ref 0.0–0.2)

## 2022-07-19 LAB — BETA-2-GLYCOPROTEIN I ABS, IGG/M/A
Beta-2 Glyco I IgG: <9 GPI IgG units (ref 0–20)
Beta-2-Glycoprotein I IgA: <9 GPI IgA units (ref 0–25)
Beta-2-Glycoprotein I IgM: <9 GPI IgM units (ref 0–32)

## 2022-07-19 LAB — HEPARIN LEVEL (UNFRACTIONATED): Heparin Unfractionated: 0.36 IU/mL (ref 0.30–0.70)

## 2022-07-19 MED ORDER — ORAL CARE MOUTH RINSE: 15.0000 mL | OROMUCOSAL | Status: DC | PRN

## 2022-07-19 NOTE — Plan of Care (Signed)

## 2022-07-19 NOTE — Progress Notes (Addendum)
STROKE TEAM PROGRESS NOTE   INTERVAL HISTORY No family at the bedside.  Patient is sitting in the chair in no apparent distress, on a heparin drip. EEG checked yesterday which was normal no identified seizures. Scheduled for a formal cerebral angiogram by NIR on Monday Vital signs stable and neurological exam is stable Homocystine, Antithrombin III and anticardiolipin antibodies are normal.  Rest of the hypercoagulable panel is pending Vitals:   07/18/22 2043 07/19/22 0007 07/19/22 0350 07/19/22 0812  BP: 117/86 103/70 117/68 119/77  Pulse: 80 93 76 86  Resp: 20 18 19 18   Temp: 97.9 F (36.6 C) 97.7 F (36.5 C) 97.7 F (36.5 C) 98.2 F (36.8 C)  TempSrc: Oral Oral Oral   SpO2: 98% 94% 99% 99%  Weight:      Height:       CBC:  Recent Labs  Lab 07/16/22 1017 07/16/22 1034 07/18/22 0118 07/19/22 0305  WBC 5.9   < > 8.3 6.8  NEUTROABS 3.4  --   --   --   HGB 15.2   < > 15.6 13.6  HCT 43.4   < > 44.4 39.6  MCV 86.1   < > 87.1 87.6  PLT 281   < > 268 234   < > = values in this interval not displayed.    Basic Metabolic Panel:  Recent Labs  Lab 07/16/22 1017 07/16/22 1034  NA 137 138  K 4.0 4.1  CL 101 101  CO2 26  --   GLUCOSE 108* 105*  BUN 15 18  CREATININE 1.07 1.00  CALCIUM 9.6  --     Lipid Panel:  Recent Labs  Lab 07/17/22 0412  CHOL 174  TRIG 98  HDL 55  CHOLHDL 3.2  VLDL 20  LDLCALC 99    HgbA1c:  Recent Labs  Lab 07/16/22 2020  HGBA1C 5.2    Urine Drug Screen:  Recent Labs  Lab 07/16/22 2319  LABOPIA NONE DETECTED  COCAINSCRNUR NONE DETECTED  LABBENZ NONE DETECTED  AMPHETMU NONE DETECTED  THCU NONE DETECTED  LABBARB NONE DETECTED     Alcohol Level  Recent Labs  Lab 07/16/22 1017  ETH <10     IMAGING past 24 hours EEG adult  Result Date: 07/18/2022 Rejeana Brock, MD     07/18/2022  3:42 PM History: 57 yo male with dural venous sinus thrombosis being evaluated for possible seizure Sedation: None Technique: This  EEG was acquired with electrodes placed according to the International 10-20 electrode system (including Fp1, Fp2, F3, F4, C3, C4, P3, P4, O1, O2, T3, T4, T5, T6, A1, A2, Fz, Cz, Pz). The following electrodes were missing or displaced: none. Background: The background consists of intermixed alpha and beta activities. There is a well defined posterior dominant rhythm of 9-10 hz that attenuates with eye opening. Sleep is very briefly recorded. Photic stimulation: Physiologic driving is present EEG Abnormalities: None Clinical Interpretation: This normal EEG is recorded in the waking and sleep state. There was no seizure or seizure predisposition recorded on this study. Please note that lack of epileptiform activity on EEG does not preclude the possibility of epilepsy. Ritta Slot, MD [facility] [phone] If 7pm- 7am, please page neurology on call as listed in AMION.    PHYSICAL EXAM  Temp:  [97.7 F (36.5 C)-98.3 F (36.8 C)] 98.2 F (36.8 C) (04/21 0812) Pulse Rate:  [76-93] 86 (04/21 0812) Resp:  [18-20] 18 (04/21 0812) BP: (103-141)/(68-86) 119/77 (04/21 0812) SpO2:  [94 %-  99 %] 99 % (04/21 0812)  General - Well nourished, well developed pleasant middle-age male, in no apparent distress Cardiovascular - Regular rhythm and rate.  Mental Status -  Level of arousal and orientation to time, place, and person were intact. Language including expression, naming, repetition, comprehension was assessed and found intact. Attention span and concentration were normal. Recent and remote memory were intact. Fund of Knowledge was assessed and was intact.  Cranial Nerves II - XII - II - Visual field intact OU. III, IV, VI - Extraocular movements intact. V - Facial sensation intact bilaterally. VII - Facial movement intact bilaterally. VIII - Hearing & vestibular intact bilaterally. X - Palate elevates symmetrically. XI - Chin turning & shoulder shrug intact bilaterally. XII -  Tongue protrusion intact.  Motor Strength - The patient's strength was normal in all extremities and pronator drift was absent.  Bulk was normal and fasciculations were absent.   Motor Tone - Muscle tone was assessed at the neck and appendages and was normal.  Sensory - Light touch, temperature/pinprick were assessed and were symmetrical.    Coordination - The patient had normal movements in the hands and feet with no ataxia or dysmetria.  Tremor was absent.  Gait and Station - deferred.  ASSESSMENT/PLAN Donald Galloway is a 57 y.o. male with history of hypertension, hyperlipidemia, and sleep apnea who presented to the ED 07/16/22 for evaluation of fluctuating levels of altered mental status with onset on 07/15/2022.  The patient states that while he was at work yesterday, he felt that he was having trouble comprehending others as they were speaking to him and at times, he "zoned out" and did not hear their spoken words at all.  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 Linear hyperdensity in the left middle cranial fossa is nonspecific, but could represent a small amount of subarachnoid blood products. 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 There was no seizure or seizure  predisposition recorded on this study. Please note that lack of epileptiform activity on EEG does not preclude the possibility of epilepsy.  DSA on Monday LDL 99 HgbA1c 5.2 UDS neg Hypercoagulable work up pending  but homocystine and anticardiolipin antibodies are normal VTE prophylaxis -IV heparin 81 mg prior to admission, now on IV heparin.  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 Sherlon Handing from IR Plan for DSA on Monday  Hypertension Home meds: lisinopril 20 mg, Stable On lisino 20 and norvasc 5 BP goal<160 Long-term BP goal normotensive  Hyperlipidemia Home meds: Crestor 5 mg LDL 99, goal < 70 On crestor 20 Continue statin  at discharge  Other Stroke Risk Factors Obstructive sleep apnea, on CPAP at home  Hospital day # 3  Gevena Mart DNP, ACNPC-AG  [facility] I have personally obtained history,examined this patient, reviewed notes, independently viewed imaging studies, participated in medical decision making and plan of care.ROS completed by me personally and pertinent positives fully documented  I have made any additions or clarifications directly to the above note. Agree with note above.  Continue IV heparin till diagnostic catheter angiogram tomorrow and likely switch to Eliquis at discharge.  Long discussion with patient and answered questions.  Greater than 50% time during this 35-minute visit was spent in counseling coordination of care and discussion patient and care team venous sinus thrombosis and anticoagulation and answering questions  Delia Heady, MD Medical Director [facility] Pager: [phone] 07/19/2022 1:59 PM    To contact Stroke Continuity provider, please refer to [email or web address]. After  hours, contact General Neurology

## 2022-07-19 NOTE — Progress Notes (Signed)
  Progress Note   Patient: Donald Galloway WNU:[id number] DOB: [birth year] DOA: 07/16/2022     3 DOS: the patient was seen and examined on 07/19/2022       Brief hospital course: Donald Galloway is a 57 y.o. M with HTN, HLD and OSA who presented with 2 days waxing and waning language disturbance and frontal HA.  In the ER, [employer] showed dural sinus thrombosis, confirmed with CT venogram.   Started on heparin, Neurology consulted     Assessment and Plan: * Dural sinus thrombosis  Echo, DVT study and EEG normal - Continue anticoagulation - Continue IV fludis one more day - Angiogram Monday then start DOAC and d/c with outpatient f/u      Hyperlipidemia - Cnotinue Crestor  Essential hypertension BP controlled - Continue amlo, lisin          Subjective: No new concerns, no nursing concerns    Physical Exam: BP 118/78 (BP Location: Right Arm)   Pulse 89   Temp 98.1 F (36.7 C) (Oral)   Resp 18   Ht  (1.702 m)   Wt 79.6 kg   SpO2 100%   BMI 27.49 kg/m   Adult male, lying in bed, interactive and appropriate, face symmetric, speech fluent, interactions appropriate  Data Reviewed: CBC unremarkable  Family Communication: None present    Disposition: Status is: Inpatient Stable awaiting procedure on Monday then home        Author: Alberteen Sam, MD 07/19/2022 4:47 PM  For on call review [email or web address].

## 2022-07-19 NOTE — Plan of Care (Signed)
  Problem: Education: Goal: Knowledge of disease or condition will improve Outcome: Progressing Goal: Knowledge of secondary prevention will improve (MUST DOCUMENT ALL) Outcome: Progressing Goal: Knowledge of patient specific risk factors will improve (Mark N/A or DELETE if not current risk factor) Outcome: Progressing   Problem: Ischemic Stroke/TIA Tissue Perfusion: Goal: Complications of ischemic stroke/TIA will be minimized Outcome: Progressing   

## 2022-07-19 NOTE — Progress Notes (Signed)
ANTICOAGULATION CONSULT NOTE - Follow Up Consult  Pharmacy Consult for Heparin Indication:  dural venous thrombosis  Allergies  Allergen Reactions   Zolpidem Tartrate     Leaves him groggy in the AM    Patient Measurements: Height:  (170.2 cm) Weight: 79.6 kg (175 lb 7.8 oz) IBW/kg (Calculated) : 66.1 Heparin Dosing Weight: 79.6 kg  Vital Signs: Temp: 97.7 F (36.5 C) (04/21 0350) Temp Source: Oral (04/21 0350) BP: 117/68 (04/21 0350) Pulse Rate: 76 (04/21 0350)  Labs: Recent Labs    07/16/22 1017 07/16/22 1034 07/16/22 2020 07/17/22 0412 07/17/22 1753 07/18/22 0118 07/19/22 0305  HGB 15.2 15.0  --  15.7  --  15.6 13.6  HCT 43.4 44.0  --  44.1  --  44.4 39.6  PLT 281  --   --  273  --  268 234  APTT 27  --   --   --   --   --   --   LABPROT 12.9  --   --   --   --   --   --   INR 1.0  --   --   --   --   --   --   HEPARINUNFRC  --   --    < > 0.60 0.57 0.47 0.36  CREATININE 1.07 1.00  --   --   --   --   --    < > = values in this interval not displayed.    Estimated Creatinine Clearance: 82.4 mL/min (by C-G formula based on SCr of 1 mg/dL).   Medications:  Scheduled:   amLODipine  5 mg Oral Daily   lisinopril  20 mg Oral Daily   rosuvastatin  20 mg Oral Daily   senna-docusate  2 tablet Oral BID   sodium chloride flush  3 mL Intravenous Once   traZODone  50 mg Oral QHS   Infusions:   sodium chloride 125 mL/hr at 07/19/22 0211   heparin 1,000 Units/hr (07/18/22 1455)    Assessment: 57 yo M presents to ED with acute memory loss and aphasia since 07/15/22. CT head c/f dural venous thrombosis. CT venogram shows filling defects in the L transverse sinus, L sigmoid sinus, and in the jugular bulb concerning for thrombosis. Patient was not on anticoagulation prior to admission. Pharmacy consulted to dose and manage heparin infusion per neuro protocol, no bolus.   Heparin level today is therapeutic at 0.36, on 1000 units/hr. CBC stable. No line issues or  signs/symptoms of bleeding noted per RN.  Goal of Therapy:  Heparin level 0.3-0.5 units/ml Monitor platelets by anticoagulation protocol: Yes   Plan:  Continue IV heparin at 1000 units/hr. Daily CBC, heparin level. Monitor for signs/symptoms of bleeding.   Jerrilyn Cairo, PharmD PGY-2 Pharmacy Resident Phone [phone] 07/19/2022 7:27 AM   Please check AMION for all [facility] Pharmacy phone numbers After 10:00 PM, call Main Pharmacy [phone]

## 2022-07-19 NOTE — Plan of Care (Signed)
  Problem: Education: Goal: Knowledge of disease or condition will improve Outcome: Progressing   Problem: Coping: Goal: Will verbalize positive feelings about self Outcome: Progressing   Problem: Health Behavior/Discharge Planning: Goal: Ability to manage health-related needs will improve Outcome: Progressing   Problem: Self-Care: Goal: Ability to participate in self-care as condition permits will improve Outcome: Progressing   Problem: Nutrition: Goal: Dietary intake will improve Outcome: Progressing   Problem: Education: Goal: Knowledge of General Education information will improve Description: Including pain rating scale, medication(s)/side effects and non-pharmacologic comfort measures Outcome: Progressing   Problem: Activity: Goal: Risk for activity intolerance will decrease Outcome: Progressing

## 2022-07-20 ENCOUNTER — Inpatient Hospital Stay (HOSPITAL_COMMUNITY): Payer: 59

## 2022-07-20 DIAGNOSIS — G08 Intracranial and intraspinal phlebitis and thrombophlebitis: Secondary | ICD-10-CM | POA: Diagnosis not present

## 2022-07-20 HISTORY — PX: IR ANGIO EXTERNAL CAROTID SEL EXT CAROTID BILAT MOD SED: IMG5372

## 2022-07-20 HISTORY — PX: IR ANGIO INTRA EXTRACRAN SEL INTERNAL CAROTID BILAT MOD SED: IMG5363

## 2022-07-20 HISTORY — PX: IR ANGIO VERTEBRAL SEL VERTEBRAL UNI L MOD SED: IMG5367

## 2022-07-20 HISTORY — PX: IR US GUIDE VASC ACCESS RIGHT: IMG2390

## 2022-07-20 LAB — CBC
HCT: 39.7 % (ref 39.0–52.0)
Hemoglobin: 13.6 g/dL (ref 13.0–17.0)
MCH: 30 pg (ref 26.0–34.0)
MCHC: 34.3 g/dL (ref 30.0–36.0)
MCV: 87.4 fL (ref 80.0–100.0)
Platelets: 224 10*3/uL (ref 150–400)
RBC: 4.54 MIL/uL (ref 4.22–5.81)
RDW: 11.6 % (ref 11.5–15.5)
WBC: 7.2 10*3/uL (ref 4.0–10.5)
nRBC: 0 % (ref 0.0–0.2)

## 2022-07-20 LAB — LUPUS ANTICOAGULANT PANEL
DRVVT: 35.7 s (ref 0.0–47.0)
PTT Lupus Anticoagulant: 32.9 s (ref 0.0–43.5)

## 2022-07-20 LAB — HEPARIN LEVEL (UNFRACTIONATED)
Heparin Unfractionated: 0.39 IU/mL (ref 0.30–0.70)
Heparin Unfractionated: 0.44 IU/mL (ref 0.30–0.70)

## 2022-07-20 LAB — PROTEIN S ACTIVITY: Protein S Activity: 107 % (ref 63–140)

## 2022-07-20 LAB — PROTEIN S, TOTAL: Protein S Ag, Total: 109 % (ref 60–150)

## 2022-07-20 LAB — PROTIME-INR
INR: 1.1 (ref 0.8–1.2)
Prothrombin Time: 13.8 seconds (ref 11.4–15.2)

## 2022-07-20 LAB — PROTEIN C, TOTAL: Protein C, Total: 133 % (ref 60–150)

## 2022-07-20 LAB — PROTEIN C ACTIVITY: Protein C Activity: 124 % (ref 73–180)

## 2022-07-20 MED ORDER — MIDAZOLAM HCL 2 MG/2ML IJ SOLN
INTRAMUSCULAR | Status: AC | PRN
  Administered 2022-07-20 (×3): .5 mg via INTRAVENOUS

## 2022-07-20 MED ORDER — HEPARIN SODIUM (PORCINE) 1000 UNIT/ML IJ SOLN: 5000.0000 [IU] | Freq: Once | INTRAMUSCULAR | Status: DC

## 2022-07-20 MED ORDER — VERAPAMIL HCL 2.5 MG/ML IV SOLN
INTRAVENOUS | Status: AC
  Filled 2022-07-20: qty 2

## 2022-07-20 MED ORDER — LIDOCAINE HCL 1 % IJ SOLN
INTRAMUSCULAR | Status: AC
  Filled 2022-07-20: qty 20

## 2022-07-20 MED ORDER — LIDOCAINE HCL (PF) 1 % IJ SOLN: 1.0000 mL | Freq: Once | INTRAMUSCULAR | Status: DC

## 2022-07-20 MED ORDER — IOHEXOL 300 MG/ML  SOLN
150.0000 mL | Freq: Once | INTRAMUSCULAR | Status: AC | PRN
  Administered 2022-07-20: 108 mL via INTRA_ARTERIAL

## 2022-07-20 MED ORDER — FENTANYL CITRATE (PF) 100 MCG/2ML IJ SOLN
INTRAMUSCULAR | Status: AC
  Filled 2022-07-20: qty 2

## 2022-07-20 MED ORDER — HEPARIN (PORCINE) 25000 UT/250ML-% IV SOLN
1000.0000 [IU]/h | INTRAVENOUS | Status: DC
  Administered 2022-07-20 – 2022-07-21 (×3): 1000 [IU]/h via INTRAVENOUS
  Filled 2022-07-20 (×2): qty 250

## 2022-07-20 MED ORDER — NITROGLYCERIN 1 MG/10 ML FOR IR/CATH LAB
INTRA_ARTERIAL | Status: AC | PRN
  Administered 2022-07-20: 200 ug via INTRA_ARTERIAL

## 2022-07-20 MED ORDER — FENTANYL CITRATE (PF) 100 MCG/2ML IJ SOLN
INTRAMUSCULAR | Status: AC | PRN
  Administered 2022-07-20: 25 ug via INTRAVENOUS

## 2022-07-20 MED ORDER — MIDAZOLAM HCL 2 MG/2ML IJ SOLN
INTRAMUSCULAR | Status: AC
  Filled 2022-07-20: qty 2

## 2022-07-20 MED ORDER — HEPARIN SODIUM (PORCINE) 1000 UNIT/ML IJ SOLN
INTRAMUSCULAR | Status: AC | PRN
  Administered 2022-07-20: 5000 [IU] via INTRA_ARTERIAL

## 2022-07-20 MED ORDER — NITROGLYCERIN 1 MG/10 ML FOR IR/CATH LAB
INTRA_ARTERIAL | Status: AC
  Filled 2022-07-20: qty 10

## 2022-07-20 MED ORDER — HEPARIN SODIUM (PORCINE) 1000 UNIT/ML IJ SOLN
INTRAMUSCULAR | Status: AC
  Filled 2022-07-20: qty 10

## 2022-07-20 MED ORDER — VERAPAMIL HCL 2.5 MG/ML IV SOLN
INTRAVENOUS | Status: AC | PRN
  Administered 2022-07-20: 5 mg via INTRA_ARTERIAL

## 2022-07-20 NOTE — Procedures (Signed)
INTERVENTIONAL NEURORADIOLOGY BRIEF POSTPROCEDURE NOTE  DIAGNOSTIC CEREBRAL ANGIOGRAM    Attending: Dr. 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    Access site: Distal right radial artery    Access closure: Inflatable band    Anesthesia: Moderate sedation    Medication used: 1.5 mg Versed IV; 25 mcg Fentanyl IV.   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   The patient tolerated the procedure well without incident or complication and is in stable condition.

## 2022-07-20 NOTE — Progress Notes (Addendum)
ANTICOAGULATION CONSULT NOTE  Pharmacy Consult for Heparin Indication:  dural venous thrombosis  Allergies  Allergen Reactions   Zolpidem Tartrate     Leaves him groggy in the AM    Patient Measurements: Height:  (170.2 cm) Weight: 79.6 kg (175 lb 7.8 oz) IBW/kg (Calculated) : 66.1 Heparin Dosing Weight: 80 kg  Vital Signs: Temp: 98.2 F (36.8 C) (04/22 1111) Temp Source: Oral (04/22 0806) BP: 108/71 (04/22 1330) Pulse Rate: 81 (04/22 1330)  Labs: Recent Labs    07/18/22 0118 07/19/22 0305 07/20/22 0453  HGB 15.6 13.6 13.6  HCT 44.4 39.6 39.7  PLT 268 234 224  LABPROT  --   --  13.8  INR  --   --  1.1  HEPARINUNFRC 0.47 0.36 0.39    Estimated Creatinine Clearance: 82.4 mL/min (by C-G formula based on SCr of 1 mg/dL).   Medications:  Scheduled:   amLODipine  5 mg Oral Daily   heparin sodium (porcine)  5,000 Units Intravenous Once   lidocaine (PF)  1 mL Infiltration Once   lisinopril  20 mg Oral Daily   rosuvastatin  20 mg Oral Daily   senna-docusate  2 tablet Oral BID   sodium chloride flush  3 mL Intravenous Once   traZODone  50 mg Oral QHS   Infusions:   sodium chloride 125 mL/hr at 07/20/22 1152   heparin      Assessment: 58 yo M presents to ED with acute memory loss and aphasia since 07/15/22. CT head c/f dural venous thrombosis. CT venogram shows filling defects in the L transverse sinus, L sigmoid sinus, and in the jugular bulb concerning for thrombosis. Patient was not on anticoagulation prior to admission. Pharmacy consulted to dose and manage heparin infusion per neuro protocol, no bolus.   Heparin level today is therapeutic at 0.39, on 1000 units/hr. CBC stable. No line issues or signs/symptoms of bleeding noted per RN. Heparin held for IR angiogram. Pharmacy to resume 6 hours after procedure.  Goal of Therapy:  Heparin level 0.3-0.5 units/ml Monitor platelets by anticoagulation protocol: Yes   Plan:  Resume heparin infusion at 1000  units/hr  Check heparin level in 8 hours and daily while on heparin Continue to monitor H&H and platelets F/u transition to PO anticoagulant  Thank you for allowing pharmacy to be a part of this patient's care.  Thelma Barge, PharmD Clinical Pharmacist

## 2022-07-20 NOTE — Progress Notes (Signed)
ANTICOAGULATION CONSULT NOTE- follow-up  Pharmacy Consult for Heparin Indication:  dural venous thrombosis  Allergies  Allergen Reactions   Zolpidem Tartrate     Leaves him groggy in the AM    Patient Measurements: Height:  (170.2 cm) Weight: 79.6 kg (175 lb 7.8 oz) IBW/kg (Calculated) : 66.1 Heparin Dosing Weight: 80 kg  Vital Signs: Temp: 98.2 F (36.8 C) (04/22 1111) Temp Source: Oral (04/22 0806) BP: 118/77 (04/22 1751) Pulse Rate: 78 (04/22 1751)  Labs: Recent Labs    07/18/22 0118 07/19/22 0305 07/20/22 0453 07/20/22 1824  HGB 15.6 13.6 13.6  --   HCT 44.4 39.6 39.7  --   PLT 268 234 224  --   LABPROT  --   --  13.8  --   INR  --   --  1.1  --   HEPARINUNFRC 0.47 0.36 0.39 0.44     Estimated Creatinine Clearance: 82.4 mL/min (by C-G formula based on SCr of 1 mg/dL).   Medications:  Scheduled:   amLODipine  5 mg Oral Daily   heparin sodium (porcine)  5,000 Units Intravenous Once   lidocaine (PF)  1 mL Infiltration Once   lisinopril  20 mg Oral Daily   rosuvastatin  20 mg Oral Daily   senna-docusate  2 tablet Oral BID   sodium chloride flush  3 mL Intravenous Once   traZODone  50 mg Oral QHS   Infusions:   heparin Stopped (07/22/22 0900)    Assessment: 57 yo M presents to ED with acute memory loss and aphasia since 07/15/22. CT head c/f dural venous thrombosis. CT venogram shows filling defects in the L transverse sinus, L sigmoid sinus, and in the jugular bulb concerning for thrombosis. Patient was not on anticoagulation prior to admission. Pharmacy consulted to dose and manage heparin infusion per neuro protocol, no bolus.   Heparin level today is therapeutic at 0.39, on 1000 units/hr. CBC stable. No line issues or signs/symptoms of bleeding noted per RN. Heparin held for IR angiogram. Pharmacy to resume 6 hours after procedure.   4/22 PM update: Spoke with RN at 1800 for restart reminder. Provider stopped to talk to patient around 11 AM.  Patient wanted to proceed with procedure at that time. Provider asked the RN to restart the heparin drip at 11 AM  HL 0.44- therapeutic No bleed signs No issues with the heparin infusion  Goal of Therapy:  Heparin level 0.3-0.5 units/ml Monitor platelets by anticoagulation protocol: Yes   Plan:  Continue heparin infusion at 1000 units/hr Check a confirmatory heparin level in 8 hours and daily while on heparin Continue to monitor H&H and platelets F/u transition to PO anticoagulant  Thank you for allowing pharmacy to be a part of this patient's care.  Greta Doom BS, PharmD, BCPS Clinical Pharmacist 07/20/2022 7:10 PM  Contact: [phone] after 3 PM  "Be curious, not judgmental..." -Debbora Dus

## 2022-07-20 NOTE — Progress Notes (Signed)
STROKE TEAM PROGRESS NOTE   INTERVAL HISTORY His wife is at the bedside.  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  Rest of the hypercoagulable panel is pending Vitals:   07/20/22 1111 07/20/22 1135 07/20/22 1153 07/20/22 1220  BP: 107/62 106/75 110/77 113/74  Pulse: 72 83 79 76  Resp: Temp: 98.2 F (36.8 C)     TempSrc:      SpO2: 97% 99% 98% 98%  Weight:      Height:       CBC:  Recent Labs  Lab 07/16/22 1017 07/16/22 1034 07/19/22 0305 07/20/22 0453  WBC 5.9   < > 6.8 7.2  NEUTROABS 3.4  --   --   --   HGB 15.2   < > 13.6 13.6  HCT 43.4   < > 39.6 39.7  MCV 86.1   < > 87.6 87.4  PLT 281   < > 234 224   < > = values in this interval not displayed.   Basic Metabolic Panel:  Recent Labs  Lab 07/16/22 1017 07/16/22 1034  NA 137 138  K 4.0 4.1  CL 101 101  CO2 26  --   GLUCOSE 108* 105*  BUN 15 18  CREATININE 1.07 1.00  CALCIUM 9.6  --    Lipid Panel:  Recent Labs  Lab 07/17/22 0412  CHOL 174  TRIG 98  HDL 55  CHOLHDL 3.2  VLDL 20  LDLCALC 99   HgbA1c:  Recent Labs  Lab 07/16/22 2020  HGBA1C 5.2   Urine Drug Screen:  Recent Labs  Lab 07/16/22 2319  LABOPIA NONE DETECTED  COCAINSCRNUR NONE DETECTED  LABBENZ NONE DETECTED  AMPHETMU NONE DETECTED  THCU NONE DETECTED  LABBARB NONE DETECTED    Alcohol Level  Recent Labs  Lab 07/16/22 1017  ETH <10    IMAGING past 24 hours No results found.  PHYSICAL EXAM  Temp:  [98.1 F (36.7 C)-98.4 F (36.9 C)] 98.2 F (36.8 C) (04/22 1111) Pulse Rate:  [72-93] 76 (04/22 1220) Resp:  [10-22] 12 (04/22 1220) BP: (103-132)/(62-89) 113/74 (04/22 1220) SpO2:  [96 %-100 %] 98 % (04/22 1220)  General - Well  nourished, well developed pleasant middle-age male, in no apparent distress Cardiovascular - Regular rhythm and rate.  Mental Status -  Level of arousal and orientation to time, place, and person were intact. Language including expression, naming, repetition, comprehension was assessed and found intact. Attention span and concentration were normal. Recent and remote memory were intact. Fund of Knowledge was assessed and was intact.  Cranial Nerves II - XII - II - Visual field intact OU. III, IV, VI - Extraocular movements intact. V - Facial sensation intact bilaterally. VII - Facial movement intact bilaterally. VIII - Hearing & vestibular intact bilaterally. X - Palate elevates symmetrically. XI - Chin turning & shoulder shrug intact bilaterally. XII - Tongue protrusion intact.  Motor Strength - The patient's strength was normal in all extremities and pronator drift was absent.  Bulk was normal and fasciculations were absent.   Motor Tone - Muscle tone was assessed at the neck and appendages and was normal.  Sensory - Light touch, temperature/pinprick were assessed and were symmetrical.    Coordination - The  patient had normal movements in the hands and feet with no ataxia or dysmetria.  Tremor was absent.  Gait and Station - deferred.  ASSESSMENT/PLAN Donald Galloway is a 57 y.o. male with history of hypertension, hyperlipidemia, and sleep apnea who presented to the ED 07/16/22 for evaluation of fluctuating levels of altered mental status with onset on 07/15/2022.  The patient states that while he was at work yesterday, he felt that he was having trouble comprehending others as they were speaking to him and at times, he "zoned out" and did not hear their spoken words at all.  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 Linear hyperdensity in the left middle cranial fossa is nonspecific, but could represent a small amount of subarachnoid blood products. 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 There was no seizure or seizure predisposition recorded on this study. Please note that lack of epileptiform activity on EEG does not preclude the possibility of epilepsy.  DSA on Monday LDL 99 HgbA1c 5.2 UDS neg Hypercoagulable work up pending  but homocystine and anticardiolipin antibodies are normal VTE prophylaxis -IV heparin 81 mg prior to admission, now on IV heparin.  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 Sherlon Handing from IR Plan for DSA on Monday  Hypertension Home meds: lisinopril 20 mg, Stable On lisino 20 and norvasc 5 BP goal<160 Long-term BP goal normotensive  Hyperlipidemia Home meds: Crestor 5 mg LDL 99, goal < 70 On crestor 20 Continue statin at discharge  Other Stroke Risk Factors Obstructive sleep apnea, on CPAP at home  Hospital day # 4     The patient remains at significant risk for recurrent intact cerebral hemorrhage and dural sinus thrombosis and strokes due to high flow dural AV  fistula.  I had a long discussion with the patient and his wife regarding treatment options including endovascular treatment for dural AV fistula versus surgical treatment.  Patient was offered possibility of a second opinion after tertiary care center like [facility] and [facility] if he chooses.  He was also offered possibility of neurosurgical consultation to discuss surgical treatment but he refused surgery at this time.  Since patient has no neurological deficits and MRI shows no infarct or brain hemorrhage he would benefit with elective endovascular treatment in the next few days pending availability of anesthesia of the procedure.  Long discussion with Dr. Sherlon Handing who is in agreement with plan to treat dual AV fistula.  Patient sent home on long-term anticoagulation.  Discussed with Dr. Maryfrances Bunnell.  Patient and wife were given time to review this information and make that  decision and let us know later today.  They voiced understanding greater than 50% time during this 50-minute visit was spent in counseling coordination of care and discussion patient and care team venous sinus thrombosis and anticoagulation and answering questions  Donald Heady, MD Medical Director [facility] Pager: [phone] 07/20/2022 12:50 PM    To contact Stroke Continuity provider, please refer to [email or web address]. After hours, contact General Neurology

## 2022-07-20 NOTE — Sedation Documentation (Signed)
Patient transported to 3 west. Rosey Bath RN at the bedside to receive patient. Right distal radial band in place. No bleeding from site. Cap refill intact. Intact PMS to hand. No hematoma noted.

## 2022-07-20 NOTE — Progress Notes (Signed)
  Progress Note   Patient: Donald Galloway ZOX:[id number] DOB: 1966/02/14 DOA: 07/16/2022     4 DOS: the patient was seen and examined on 07/20/2022       Brief hospital course: Mr. Hounshell is a 57 y.o. M with HTN, HLD and OSA who presented with 2 days waxing and waning language disturbance and frontal HA.  In the ER, [facility] showed dural sinus thrombosis, confirmed with CT venogram.   Started on heparin, Neurology consulted     Assessment and Plan: * Dural sinus thrombosis  Intracranial AVM Echo, DVT study and EEG normal Angiogram today showed large intracranial AVM - Continue anticoagulation - Stop fluids - Plan for embolization on Wednesday     Hyperlipidemia - Continue Crestor  Essential hypertension BP controlled - Continue amlodipine, lisinopril           Subjective: Feeling well post procedure, no concerns, no nursing concerns    Physical Exam: BP 118/77 (BP Location: Left Arm)   Pulse 78   Temp 98.2 F (36.8 C)   Resp 12   Ht  (1.702 m)   Wt 79.6 kg   SpO2 99%   BMI 27.49 kg/m   Adult male, sitting by the bedside, interactive and appropriate, speech fluent, face symmetric, interactions appropriate, gait normal.  Data Reviewed: CBC unremarkable  Family Communication: Wife    Disposition: Status is: Inpatient Stable awaiting procedure on Wednesday then home        Author: Alberteen Sam, MD 07/20/2022 6:15 PM  For on call review [email or web address].

## 2022-07-20 NOTE — Plan of Care (Signed)
  Problem: Education: Goal: Knowledge of disease or condition will improve Outcome: Progressing Goal: Knowledge of secondary prevention will improve (MUST DOCUMENT ALL) Outcome: Progressing Goal: Knowledge of patient specific risk factors will improve (Mark N/A or DELETE if not current risk factor) Outcome: Progressing   Problem: Ischemic Stroke/TIA Tissue Perfusion: Goal: Complications of ischemic stroke/TIA will be minimized Outcome: Progressing   Problem: Coping: Goal: Will verbalize positive feelings about self Outcome: Progressing Goal: Will identify appropriate support needs Outcome: Progressing   Problem: Health Behavior/Discharge Planning: Goal: Ability to manage health-related needs will improve Outcome: Progressing Goal: Goals will be collaboratively established with patient/family Outcome: Progressing   Problem: Self-Care: Goal: Ability to participate in self-care as condition permits will improve Outcome: Progressing Goal: Verbalization of feelings and concerns over difficulty with self-care will improve Outcome: Progressing Goal: Ability to communicate needs accurately will improve Outcome: Progressing   Problem: Nutrition: Goal: Risk of aspiration will decrease Outcome: Progressing Goal: Dietary intake will improve Outcome: Progressing   Problem: Education: Goal: Knowledge of General Education information will improve Description: Including pain rating scale, medication(s)/side effects and non-pharmacologic comfort measures Outcome: Progressing   Problem: Health Behavior/Discharge Planning: Goal: Ability to manage health-related needs will improve Outcome: Progressing   Problem: Clinical Measurements: Goal: Ability to maintain clinical measurements within normal limits will improve Outcome: Progressing Goal: Will remain free from infection Outcome: Progressing Goal: Diagnostic test results will improve Outcome: Progressing Goal: Respiratory  complications will improve Outcome: Progressing Goal: Cardiovascular complication will be avoided Outcome: Progressing   Problem: Activity: Goal: Risk for activity intolerance will decrease Outcome: Progressing   Problem: Nutrition: Goal: Adequate nutrition will be maintained Outcome: Progressing   Problem: Coping: Goal: Level of anxiety will decrease Outcome: Progressing   Problem: Elimination: Goal: Will not experience complications related to bowel motility Outcome: Progressing Goal: Will not experience complications related to urinary retention Outcome: Progressing   Problem: Pain Managment: Goal: General experience of comfort will improve Outcome: Progressing   Problem: Safety: Goal: Ability to remain free from injury will improve Outcome: Progressing   Problem: Skin Integrity: Goal: Risk for impaired skin integrity will decrease Outcome: Progressing   Problem: Education: Goal: Understanding of CV disease, CV risk reduction, and recovery process will improve Outcome: Progressing Goal: Individualized Educational Video(s) Outcome: Progressing   Problem: Activity: Goal: Ability to return to baseline activity level will improve Outcome: Progressing   Problem: Cardiovascular: Goal: Ability to achieve and maintain adequate cardiovascular perfusion will improve Outcome: Progressing Goal: Vascular access site(s) Level 0-1 will be maintained Outcome: Progressing   Problem: Health Behavior/Discharge Planning: Goal: Ability to safely manage health-related needs after discharge will improve Outcome: Progressing   

## 2022-07-21 ENCOUNTER — Other Ambulatory Visit (HOSPITAL_COMMUNITY): Payer: Self-pay | Admitting: Interventional Radiology

## 2022-07-21 ENCOUNTER — Other Ambulatory Visit: Payer: Self-pay | Admitting: Student

## 2022-07-21 ENCOUNTER — Other Ambulatory Visit (HOSPITAL_COMMUNITY): Payer: Self-pay

## 2022-07-21 DIAGNOSIS — Q282 Arteriovenous malformation of cerebral vessels: Secondary | ICD-10-CM

## 2022-07-21 DIAGNOSIS — G08 Intracranial and intraspinal phlebitis and thrombophlebitis: Secondary | ICD-10-CM | POA: Diagnosis not present

## 2022-07-21 DIAGNOSIS — I609 Nontraumatic subarachnoid hemorrhage, unspecified: Secondary | ICD-10-CM | POA: Diagnosis not present

## 2022-07-21 DIAGNOSIS — I10 Essential (primary) hypertension: Secondary | ICD-10-CM | POA: Diagnosis not present

## 2022-07-21 DIAGNOSIS — E785 Hyperlipidemia, unspecified: Secondary | ICD-10-CM | POA: Diagnosis not present

## 2022-07-21 LAB — CBC
HCT: 38.6 % — ABNORMAL LOW (ref 39.0–52.0)
Hemoglobin: 13.8 g/dL (ref 13.0–17.0)
MCH: 30 pg (ref 26.0–34.0)
MCHC: 35.8 g/dL (ref 30.0–36.0)
MCV: 83.9 fL (ref 80.0–100.0)
Platelets: 220 10*3/uL (ref 150–400)
RBC: 4.6 MIL/uL (ref 4.22–5.81)
RDW: 11.5 % (ref 11.5–15.5)
WBC: 6.2 10*3/uL (ref 4.0–10.5)
nRBC: 0 % (ref 0.0–0.2)

## 2022-07-21 LAB — HEPARIN LEVEL (UNFRACTIONATED): Heparin Unfractionated: 0.38 IU/mL (ref 0.30–0.70)

## 2022-07-21 MED ORDER — CEFAZOLIN SODIUM-DEXTROSE 2-4 GM/100ML-% IV SOLN
2.0000 g | Freq: Three times a day (TID) | INTRAVENOUS | Status: DC
  Administered 2022-07-22 (×3): 2 g via INTRAVENOUS
  Filled 2022-07-21: qty 100

## 2022-07-21 MED ORDER — MUPIROCIN 2 % EX OINT
1.0000 | TOPICAL_OINTMENT | Freq: Two times a day (BID) | CUTANEOUS | Status: DC
  Administered 2022-07-22 – 2022-07-24 (×3): 1 via NASAL
  Filled 2022-07-21 (×3): qty 22

## 2022-07-21 NOTE — Progress Notes (Signed)
  Progress Note   Patient: Donald Galloway JXB:[id number] DOB: Sep 09, 1965 DOA: 07/16/2022     5 DOS: the patient was seen and examined on 07/21/2022        Brief hospital course: Mr. Speas is a 57 y.o. M with HTN, HLD and OSA who presented with 2 days waxing and waning language disturbance and frontal HA.   In the ER, [facility] showed dural sinus thrombosis, confirmed with CT venogram.  MRI showed small SAH.   4/18: Started on heparin, Neurology consulted 4/19: CTA showed intracranial AVM 4/22: Underwent angiography that confirmed large AVM, recommended for embolization, planned for 4/24     Assessment and Plan: * Dural sinus thrombosis Presented with TIA like symptoms, discovered to have dural sinus thrombosis, small SAH and large intracranial AVM.    Neurology and neuro-interventional radiology consulted.  Started on heparin.    Echo normal, [place] LE negative for DVT.   - Continue heparin gtt - Can follow hypercoagulable work up but likely thrombosis is from AVM - Consult Neurology - Consult neurointerventional radiology, plan for AVM treatment tomorrow morning - To ICU post-procedure (see signout for details of handoff)    AVM (arteriovenous malformation) brain - Consult neurointerventional radiology  SAH (subarachnoid hemorrhage) Small, also likely due to AVM  Hyperlipidemia - Continue Crestor  Essential hypertension BP normal - Continue amlodipine, lisinopril          Subjective: No complaints     Physical Exam: BP 127/83 (BP Location: Right Arm)   Pulse 91   Temp 98 F (36.7 C) (Oral)   Resp 14   Ht  (1.702 m)   Wt 79.6 kg   SpO2 97%   BMI 27.49 kg/m   Playing backgammon with wife, interactive and appropriate.  Data Reviewed: CBC unremarkable  Family Communication: Wife    Disposition: Status is: Inpatient Admitted for TIA like symptoms, found to have dural venous sinus thrombosis and subsequently found to have AVM  Will have this  treated by IR tomorrow and transferred to ICU for monitoring afterwards        Author: Alberteen Sam, MD 07/21/2022 6:44 PM  For on call review [email or web address].

## 2022-07-21 NOTE — TOC Progression Note (Signed)
Transition of Care [facility]) - Progression Note    Patient Details  Name: Donald Galloway MRN: [id number] Date of Birth: [birth year]  Transition of [facility] CM/SW Contact  Kermit Balo, RN Phone Number: 07/21/2022, 10:38 AM  Clinical Narrative:    Plan for embolization on Wednesday. TOC following for d/c needs.    Expected Discharge Plan: Home/Self Care    Expected Discharge Plan and Services                                               Social Determinants of Health (SDOH) Interventions SDOH Screenings   Food Insecurity: No Food Insecurity (07/16/2022)  Housing: Low Risk  (07/16/2022)  Transportation Needs: No Transportation Needs (07/16/2022)  Utilities: Not At Risk (07/16/2022)  Tobacco Use: Unknown (07/20/2022)    Readmission Risk Interventions     No data to display

## 2022-07-21 NOTE — Progress Notes (Signed)
ANTICOAGULATION CONSULT NOTE- follow-up  Pharmacy Consult for Heparin Indication:  dural venous thrombosis  Allergies  Allergen Reactions   Zolpidem Tartrate     Leaves him groggy in the AM    Patient Measurements: Height:  (170.2 cm) Weight: 79.6 kg (175 lb 7.8 oz) IBW/kg (Calculated) : 66.1 Heparin Dosing Weight: 80 kg  Vital Signs: Temp: 98.6 F (37 C) (04/23 0746) Temp Source: Oral (04/23 0746) BP: 118/77 (04/23 0746) Pulse Rate: 85 (04/23 0746)  Labs: Recent Labs    07/19/22 0305 07/20/22 0453 07/20/22 1824 07/21/22 0445  HGB 13.6 13.6  --  13.8  HCT 39.6 39.7  --  38.6*  PLT 234 224  --  220  LABPROT  --  13.8  --   --   INR  --  1.1  --   --   HEPARINUNFRC 0.36 0.39 0.44 0.38    Estimated Creatinine Clearance: 82.4 mL/min (by C-G formula based on SCr of 1 mg/dL).   Assessment: 57 yo M presents to ED with acute memory loss and aphasia since 07/15/22. CT head c/f dural venous thrombosis. CT venogram shows filling defects in the L transverse sinus, L sigmoid sinus, and in the jugular bulb concerning for thrombosis. Patient was not on anticoagulation prior to admission. Pharmacy consulted to dose and manage heparin infusion per neuro protocol, no bolus.   Heparin level today is therapeutic at 0.38, on 1000 units/hr. CBC stable. No line issues or signs/symptoms of bleeding noted. Plan is for intracranial AVM embolization on 4/24.  Goal of Therapy:  Heparin level 0.3-0.5 units/ml Monitor platelets by anticoagulation protocol: Yes   Plan:  Continue heparin infusion at 1000 units/hr Continue to monitor H&H and platelets Monitor for s/sx of bleeding F/u transition to PO anticoagulant  Thank you for involving pharmacy in this patient's care.  Loura Back, PharmD, BCPS Clinical Pharmacist Clinical phone for 07/21/2022 is [phone] 07/21/2022 12:51 PM

## 2022-07-21 NOTE — Progress Notes (Signed)
STROKE TEAM PROGRESS NOTE   INTERVAL HISTORY His wife is at the bedside.  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  Patient is scheduled for endovascular fistula treatment tomorrow under general anesthesia. Vitals:   07/20/22 1751 07/20/22 2209 07/21/22 0513 07/21/22 0746  BP: 118/77 119/75 116/73 118/77  Pulse: 78 83 81 85  Resp:  Temp:   97.9 F (36.6 C) 98.6 F (37 C)  TempSrc:   Oral Oral  SpO2: 99% 97% 100% 98%  Weight:      Height:       CBC:  Recent Labs  Lab 07/16/22 1017 07/16/22 1034 07/20/22 0453 07/21/22 0445  WBC 5.9   < > 7.2 6.2  NEUTROABS 3.4  --   --   --   HGB 15.2   < > 13.6 13.8  HCT 43.4   < > 39.7 38.6*  MCV 86.1   < > 87.4 83.9  PLT 281   < > 224 220   < > = values in this interval not displayed.   Basic Metabolic Panel:  Recent Labs  Lab 07/16/22 1017 07/16/22 1034  NA 137 138  K 4.0 4.1  CL 101 101  CO2 26  --   GLUCOSE 108* 105*  BUN 15 18  CREATININE 1.07 1.00  CALCIUM 9.6  --    Lipid Panel:  Recent Labs  Lab 07/17/22 0412  CHOL 174  TRIG 98  HDL 55  CHOLHDL 3.2  VLDL 20  LDLCALC 99   HgbA1c:  Recent Labs  Lab 07/16/22 2020  HGBA1C 5.2   Urine Drug Screen:  Recent Labs  Lab 07/16/22 2319  LABOPIA NONE DETECTED  COCAINSCRNUR NONE DETECTED  LABBENZ NONE DETECTED  AMPHETMU NONE DETECTED  THCU NONE DETECTED  LABBARB NONE DETECTED    Alcohol Level  Recent Labs  Lab 07/16/22 1017  ETH <10    IMAGING past 24 hours No results found.  PHYSICAL EXAM  Temp:  [97.9 F (36.6 C)-98.6 F (37 C)] 98.6 F (37 C) (04/23 0746) Pulse Rate:  [78-97] 85 (04/23 0746) Resp:  [15-16] 15 (04/23 0746) BP:  (108-147)/(71-85) 118/77 (04/23 0746) SpO2:  [97 %-100 %] 98 % (04/23 0746)  General - Well nourished, well developed pleasant middle-age male, in no apparent distress Cardiovascular - Regular rhythm and rate.  Mental Status -  Level of arousal and orientation to time, place, and person were intact. Language including expression, naming, repetition, comprehension was assessed and found intact. Attention span and concentration were normal. Recent and remote memory were intact. Fund of Knowledge was assessed and was intact.  Cranial Nerves II - XII - II - Visual field intact OU. III, IV, VI - Extraocular movements intact. V - Facial sensation intact bilaterally. VII - Facial movement intact bilaterally. VIII - Hearing & vestibular intact bilaterally. X - Palate elevates symmetrically. XI - Chin turning & shoulder shrug intact bilaterally. XII - Tongue protrusion intact.  Motor Strength - The patient's strength was normal in all extremities and pronator drift was absent.  Bulk was normal and fasciculations were absent.   Motor Tone - Muscle tone was assessed at the neck and appendages  and was normal.  Sensory - Light touch, temperature/pinprick were assessed and were symmetrical.    Coordination - The patient had normal movements in the hands and feet with no ataxia or dysmetria.  Tremor was absent.  Gait and Station - deferred.  ASSESSMENT/PLAN Donald Galloway is a 57 y.o. male with history of hypertension, hyperlipidemia, and sleep apnea who presented to the ED 07/16/22 for evaluation of fluctuating levels of altered mental status with onset on 07/15/2022.  The patient states that while he was at work yesterday, he felt that he was having trouble comprehending others as they were speaking to him and at times, he "zoned out" and did not hear their spoken words at all.  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 Linear hyperdensity in the left middle cranial fossa is nonspecific, but could represent a small amount of subarachnoid blood products. CTV Filling defect in the left transverse sinus, left sigmoid sinus, and left jugular bulb, concerning for thrombosis  CTA head & neck no LVO, AV fistula, small SAH, dural venous sinus thrombosis MRI  Abnormal signal in the left transverse and sigmoid sinuses and left jugular bulb, compatible with dural venous sinus thrombosis MRA AV fistula from left occipital branches to left transverse sigmoid junction IR cerebral angio : Cognard type IV dural AV fistula supplied by branches of the left middle meningeal artery and occipital artery and draining into a small patent segment of the left transverse sigmoid sinus junction and vein of Labbe.  2D Echo EF 60 to 65% Venous doppler LE bilaterally for DVT EEG This normal EEG is recorded in the waking and sleep state. There was no seizure or seizure predisposition recorded on this study. Please note that lack of epileptiform activity on EEG does not preclude the possibility of epilepsy.  LDL 99 HgbA1c 5.2 UDS neg Hypercoagulable work up pending  but homocystine and anticardiolipin antibodies are normal VTE prophylaxis -IV heparin 81 mg prior to admission, now on IV heparin.  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 Sherlon Handing from IR Plan for DSA on Monday  Hypertension Home meds: lisinopril 20 mg, Stable On lisino 20 and norvasc 5 BP goal<160 Long-term BP goal normotensive  Hyperlipidemia Home meds:  Crestor 5 mg LDL 99, goal < 70 On crestor 20 Continue statin at discharge  Other Stroke Risk Factors Obstructive sleep apnea, on CPAP at home  Hospital day # 5     The patient remains at significant risk for recurrent intact cerebral hemorrhage and dural sinus thrombosis and strokes due to high flow dural AV fistula.  I had a long discussion with the patient and his wife regarding treatment options including endovascular treatment for dural AV fistula versus surgical treatment.  Patient was offered possibility of a second opinion after tertiary care center like [facility] if he chooses.  He was also offered possibility of neurosurgical consultation to discuss surgical treatment but he refused surgery at this time.  Since patient has no neurological deficits and MRI shows no infarct or brain hemorrhage he would benefit with  elective endovascular treatment in the next few days pending availability of anesthesia of the procedure.  The patient and his wife have made a decision to have endovascular dural AV fistula treatment tomorrow with Dr. Sherlon Handing.  Long discussion with Dr. Sherlon Handing who is in agreement with plan to treat dual AV fistula.  Continue IV heparin until 1 to 2 hours prior to the procedure.  Will still need anticoagulation after procedure..  Discussed with Dr. Maryfrances Bunnell. .  They voiced understanding greater than 50% time during this 35-minute visit was spent in counseling coordination of care and discussion patient and care team venous sinus thrombosis and anticoagulation and answering questions  Delia Heady, MD Medical Director [facility] Pager: [phone] 07/21/2022 12:56 PM    To contact Stroke Continuity provider, please refer to [email or web address]. After hours, contact General Neurology

## 2022-07-21 NOTE — Plan of Care (Signed)
  Problem: Education: Goal: Knowledge of disease or condition will improve Outcome: Progressing Goal: Knowledge of secondary prevention will improve (MUST DOCUMENT ALL) Outcome: Progressing Goal: Knowledge of patient specific risk factors will improve (Mark N/A or DELETE if not current risk factor) Outcome: Progressing   Problem: Ischemic Stroke/TIA Tissue Perfusion: Goal: Complications of ischemic stroke/TIA will be minimized Outcome: Progressing   Problem: Coping: Goal: Will verbalize positive feelings about self Outcome: Progressing Goal: Will identify appropriate support needs Outcome: Progressing   Problem: Health Behavior/Discharge Planning: Goal: Ability to manage health-related needs will improve Outcome: Progressing Goal: Goals will be collaboratively established with patient/family Outcome: Progressing   Problem: Self-Care: Goal: Ability to participate in self-care as condition permits will improve Outcome: Progressing Goal: Verbalization of feelings and concerns over difficulty with self-care will improve Outcome: Progressing Goal: Ability to communicate needs accurately will improve Outcome: Progressing   Problem: Nutrition: Goal: Risk of aspiration will decrease Outcome: Progressing Goal: Dietary intake will improve Outcome: Progressing   Problem: Education: Goal: Knowledge of General Education information will improve Description: Including pain rating scale, medication(s)/side effects and non-pharmacologic comfort measures Outcome: Progressing   Problem: Health Behavior/Discharge Planning: Goal: Ability to manage health-related needs will improve Outcome: Progressing   Problem: Clinical Measurements: Goal: Ability to maintain clinical measurements within normal limits will improve Outcome: Progressing Goal: Will remain free from infection Outcome: Progressing Goal: Diagnostic test results will improve Outcome: Progressing Goal: Respiratory  complications will improve Outcome: Progressing Goal: Cardiovascular complication will be avoided Outcome: Progressing   Problem: Activity: Goal: Risk for activity intolerance will decrease Outcome: Progressing   Problem: Nutrition: Goal: Adequate nutrition will be maintained Outcome: Progressing   Problem: Coping: Goal: Level of anxiety will decrease Outcome: Progressing   Problem: Elimination: Goal: Will not experience complications related to bowel motility Outcome: Progressing Goal: Will not experience complications related to urinary retention Outcome: Progressing   Problem: Pain Managment: Goal: General experience of comfort will improve Outcome: Progressing   Problem: Safety: Goal: Ability to remain free from injury will improve Outcome: Progressing   Problem: Skin Integrity: Goal: Risk for impaired skin integrity will decrease Outcome: Progressing   Problem: Education: Goal: Understanding of CV disease, CV risk reduction, and recovery process will improve Outcome: Progressing Goal: Individualized Educational Video(s) Outcome: Progressing   Problem: Activity: Goal: Ability to return to baseline activity level will improve Outcome: Progressing   Problem: Cardiovascular: Goal: Ability to achieve and maintain adequate cardiovascular perfusion will improve Outcome: Progressing Goal: Vascular access site(s) Level 0-1 will be maintained Outcome: Progressing   Problem: Health Behavior/Discharge Planning: Goal: Ability to safely manage health-related needs after discharge will improve Outcome: Progressing   

## 2022-07-21 NOTE — Assessment & Plan Note (Signed)
-   Consult neurointerventional radiology

## 2022-07-22 ENCOUNTER — Encounter (HOSPITAL_COMMUNITY)
Admission: EM | Disposition: A | Discharge status: Home / Self Care | Payer: Self-pay | Source: Home / Self Care | Attending: Family Medicine

## 2022-07-22 ENCOUNTER — Inpatient Hospital Stay (HOSPITAL_COMMUNITY): Payer: 59 | Admitting: Anesthesiology

## 2022-07-22 ENCOUNTER — Inpatient Hospital Stay (HOSPITAL_COMMUNITY): Payer: 59

## 2022-07-22 ENCOUNTER — Encounter (HOSPITAL_COMMUNITY): Payer: Self-pay | Admitting: Internal Medicine

## 2022-07-22 DIAGNOSIS — G08 Intracranial and intraspinal phlebitis and thrombophlebitis: Secondary | ICD-10-CM | POA: Diagnosis not present

## 2022-07-22 DIAGNOSIS — I671 Cerebral aneurysm, nonruptured: Secondary | ICD-10-CM

## 2022-07-22 DIAGNOSIS — I770 Arteriovenous fistula, acquired: Secondary | ICD-10-CM

## 2022-07-22 DIAGNOSIS — Q282 Arteriovenous malformation of cerebral vessels: Secondary | ICD-10-CM | POA: Diagnosis not present

## 2022-07-22 DIAGNOSIS — I10 Essential (primary) hypertension: Secondary | ICD-10-CM | POA: Diagnosis not present

## 2022-07-22 DIAGNOSIS — E876 Hypokalemia: Secondary | ICD-10-CM | POA: Diagnosis present

## 2022-07-22 DIAGNOSIS — I609 Nontraumatic subarachnoid hemorrhage, unspecified: Secondary | ICD-10-CM | POA: Diagnosis not present

## 2022-07-22 HISTORY — PX: IR ANGIOGRAM FOLLOW UP STUDY: IMG697

## 2022-07-22 HISTORY — PX: IR ANGIO INTRA EXTRACRAN SEL INTERNAL CAROTID UNI L MOD SED: IMG5361

## 2022-07-22 HISTORY — PX: IR ANGIO VERTEBRAL SEL VERTEBRAL UNI L MOD SED: IMG5367

## 2022-07-22 HISTORY — PX: RADIOLOGY WITH ANESTHESIA: SHX6223

## 2022-07-22 HISTORY — PX: IR US GUIDE VASC ACCESS RIGHT: IMG2390

## 2022-07-22 HISTORY — PX: IR TRANSCATH/EMBOLIZ: IMG695

## 2022-07-22 LAB — CBC WITH DIFFERENTIAL/PLATELET
Abs Immature Granulocytes: 0.02 10*3/uL (ref 0.00–0.07)
Basophils Absolute: 0 10*3/uL (ref 0.0–0.1)
Basophils Relative: 1 %
Eosinophils Absolute: 0.3 10*3/uL (ref 0.0–0.5)
Eosinophils Relative: 4 %
HCT: 40.8 % (ref 39.0–52.0)
Hemoglobin: 14.3 g/dL (ref 13.0–17.0)
Immature Granulocytes: 0 %
Lymphocytes Relative: 44 %
Lymphs Abs: 2.9 10*3/uL (ref 0.7–4.0)
MCH: 30.2 pg (ref 26.0–34.0)
MCHC: 35 g/dL (ref 30.0–36.0)
MCV: 86.3 fL (ref 80.0–100.0)
Monocytes Absolute: 0.7 10*3/uL (ref 0.1–1.0)
Monocytes Relative: 10 %
Neutro Abs: 2.7 10*3/uL (ref 1.7–7.7)
Neutrophils Relative %: 41 %
Platelets: 238 10*3/uL (ref 150–400)
RBC: 4.73 MIL/uL (ref 4.22–5.81)
RDW: 11.5 % (ref 11.5–15.5)
WBC: 6.6 10*3/uL (ref 4.0–10.5)
nRBC: 0 % (ref 0.0–0.2)

## 2022-07-22 LAB — SURGICAL PCR SCREEN
MRSA, PCR: NEGATIVE
Staphylococcus aureus: POSITIVE — AB

## 2022-07-22 LAB — POCT ACTIVATED CLOTTING TIME
Activated Clotting Time: 147 seconds
Activated Clotting Time: 179 seconds
Activated Clotting Time: 196 seconds
Activated Clotting Time: 206 seconds
Activated Clotting Time: 206 seconds

## 2022-07-22 LAB — POCT I-STAT 7, (LYTES, BLD GAS, ICA,H+H)
Acid-base deficit: 4 mmol/L — ABNORMAL HIGH (ref 0.0–2.0)
Bicarbonate: 20.2 mmol/L (ref 20.0–28.0)
Calcium, Ion: 1.24 mmol/L (ref 1.15–1.40)
HCT: 39 % (ref 39.0–52.0)
Hemoglobin: 13.3 g/dL (ref 13.0–17.0)
O2 Saturation: 100 %
Potassium: 3.4 mmol/L — ABNORMAL LOW (ref 3.5–5.1)
Sodium: 139 mmol/L (ref 135–145)
TCO2: 21 mmol/L — ABNORMAL LOW (ref 22–32)
pCO2 arterial: 34.9 mmHg (ref 32–48)
pH, Arterial: 7.371 (ref 7.35–7.45)
pO2, Arterial: 220 mmHg — ABNORMAL HIGH (ref 83–108)

## 2022-07-22 LAB — BASIC METABOLIC PANEL WITH GFR
Anion gap: 10 (ref 5–15)
BUN: 10 mg/dL (ref 6–20)
CO2: 24 mmol/L (ref 22–32)
Calcium: 8.8 mg/dL — ABNORMAL LOW (ref 8.9–10.3)
Chloride: 104 mmol/L (ref 98–111)
Creatinine, Ser: 0.89 mg/dL (ref 0.61–1.24)
GFR, Estimated: >60 mL/min
Glucose, Bld: 91 mg/dL (ref 70–99)
Potassium: 3.3 mmol/L — ABNORMAL LOW (ref 3.5–5.1)
Sodium: 138 mmol/L (ref 135–145)

## 2022-07-22 LAB — PROTIME-INR
INR: 1.1 (ref 0.8–1.2)
Prothrombin Time: 14.2 seconds (ref 11.4–15.2)

## 2022-07-22 LAB — MAGNESIUM: Magnesium: 2.1 mg/dL (ref 1.7–2.4)

## 2022-07-22 LAB — HEPARIN LEVEL (UNFRACTIONATED): Heparin Unfractionated: 0.26 [IU]/mL — ABNORMAL LOW (ref 0.30–0.70)

## 2022-07-22 LAB — TYPE AND SCREEN
ABO/RH(D): A POS
Antibody Screen: NEGATIVE

## 2022-07-22 LAB — ABO/RH: ABO/RH(D): A POS

## 2022-07-22 SURGERY — IR WITH ANESTHESIA
Anesthesia: General

## 2022-07-22 MED ORDER — AMISULPRIDE (ANTIEMETIC) 5 MG/2ML IV SOLN
10.0000 mg | Freq: Once | INTRAVENOUS | Status: AC | PRN
  Administered 2022-07-22: 10 mg via INTRAVENOUS

## 2022-07-22 MED ORDER — POTASSIUM CHLORIDE CRYS ER 20 MEQ PO TBCR: 40.0000 meq | EXTENDED_RELEASE_TABLET | Freq: Once | ORAL | Status: DC

## 2022-07-22 MED ORDER — SODIUM CHLORIDE 0.9 % IV SOLN: INTRAVENOUS | Status: DC | PRN

## 2022-07-22 MED ORDER — IOHEXOL 300 MG/ML  SOLN
150.0000 mL | Freq: Once | INTRAMUSCULAR | Status: AC | PRN
  Administered 2022-07-22: 140 mL via INTRA_ARTERIAL

## 2022-07-22 MED ORDER — ONDANSETRON HCL 4 MG/2ML IJ SOLN
INTRAMUSCULAR | Status: DC | PRN
  Administered 2022-07-22: 4 mg via INTRAVENOUS

## 2022-07-22 MED ORDER — ONDANSETRON HCL 4 MG/2ML IJ SOLN
INTRAMUSCULAR | Status: AC
  Filled 2022-07-22: qty 2

## 2022-07-22 MED ORDER — CEFAZOLIN SODIUM-DEXTROSE 2-4 GM/100ML-% IV SOLN
INTRAVENOUS | Status: AC
  Filled 2022-07-22: qty 100

## 2022-07-22 MED ORDER — CHLORHEXIDINE GLUCONATE CLOTH 2 % EX PADS: 6.0000 | MEDICATED_PAD | Freq: Every day | CUTANEOUS | Status: DC

## 2022-07-22 MED ORDER — HEPARIN (PORCINE) 25000 UT/250ML-% IV SOLN
1000.0000 [IU]/h | INTRAVENOUS | Status: AC
  Administered 2022-07-22: 1000 [IU]/h via INTRAVENOUS
  Filled 2022-07-22 (×2): qty 250

## 2022-07-22 MED ORDER — LIDOCAINE 2% (20 MG/ML) 5 ML SYRINGE
INTRAMUSCULAR | Status: DC | PRN
  Administered 2022-07-22: 60 mg via INTRAVENOUS

## 2022-07-22 MED ORDER — POTASSIUM CHLORIDE 10 MEQ/100ML IV SOLN
10.0000 meq | INTRAVENOUS | Status: DC
  Administered 2022-07-22 (×2): 10 meq via INTRAVENOUS
  Filled 2022-07-22 (×4): qty 100

## 2022-07-22 MED ORDER — OXYCODONE HCL 5 MG PO TABS: 5.0000 mg | ORAL_TABLET | Freq: Once | ORAL | Status: DC | PRN

## 2022-07-22 MED ORDER — DEXAMETHASONE SODIUM PHOSPHATE 10 MG/ML IJ SOLN
INTRAMUSCULAR | Status: DC | PRN
  Administered 2022-07-22: 10 mg via INTRAVENOUS

## 2022-07-22 MED ORDER — CLEVIDIPINE BUTYRATE 0.5 MG/ML IV EMUL: 0.0000 mg/h | INTRAVENOUS | Status: DC

## 2022-07-22 MED ORDER — HEPARIN SODIUM (PORCINE) 1000 UNIT/ML IJ SOLN
INTRAMUSCULAR | Status: DC | PRN
  Administered 2022-07-22: 2000 [IU] via INTRAVENOUS
  Administered 2022-07-22: 4000 [IU] via INTRAVENOUS
  Administered 2022-07-22 (×3): 1000 [IU] via INTRAVENOUS

## 2022-07-22 MED ORDER — ACETAMINOPHEN 650 MG RE SUPP: 650.0000 mg | RECTAL | Status: DC | PRN

## 2022-07-22 MED ORDER — AMISULPRIDE (ANTIEMETIC) 5 MG/2ML IV SOLN: 10.0000 mg | Freq: Once | INTRAVENOUS | Status: DC

## 2022-07-22 MED ORDER — ROCURONIUM BROMIDE 10 MG/ML (PF) SYRINGE
PREFILLED_SYRINGE | INTRAVENOUS | Status: DC | PRN
  Administered 2022-07-22 (×2): 40 mg via INTRAVENOUS
  Administered 2022-07-22: 80 mg via INTRAVENOUS
  Administered 2022-07-22 (×3): 40 mg via INTRAVENOUS

## 2022-07-22 MED ORDER — SUGAMMADEX SODIUM 200 MG/2ML IV SOLN
INTRAVENOUS | Status: DC | PRN
  Administered 2022-07-22: 200 mg via INTRAVENOUS

## 2022-07-22 MED ORDER — POTASSIUM CHLORIDE 10 MEQ/100ML IV SOLN
INTRAVENOUS | Status: DC | PRN
  Administered 2022-07-22 (×2): 10 meq via INTRAVENOUS

## 2022-07-22 MED ORDER — ALBUMIN HUMAN 5 % IV SOLN: INTRAVENOUS | Status: DC | PRN

## 2022-07-22 MED ORDER — ONDANSETRON HCL 4 MG/2ML IJ SOLN
4.0000 mg | Freq: Once | INTRAMUSCULAR | Status: AC | PRN
  Administered 2022-07-22: 4 mg via INTRAVENOUS

## 2022-07-22 MED ORDER — FENTANYL CITRATE (PF) 100 MCG/2ML IJ SOLN
25.0000 ug | INTRAMUSCULAR | Status: DC | PRN
  Administered 2022-07-22: 50 ug via INTRAVENOUS

## 2022-07-22 MED ORDER — ACETAMINOPHEN 325 MG PO TABS
650.0000 mg | ORAL_TABLET | ORAL | Status: DC | PRN
  Administered 2022-07-23: 650 mg via ORAL

## 2022-07-22 MED ORDER — PHENYLEPHRINE HCL-NACL 20-0.9 MG/250ML-% IV SOLN
INTRAVENOUS | Status: DC | PRN
  Administered 2022-07-22: 25 ug/min via INTRAVENOUS

## 2022-07-22 MED ORDER — FENTANYL CITRATE (PF) 250 MCG/5ML IJ SOLN
INTRAMUSCULAR | Status: DC | PRN
  Administered 2022-07-22 (×2): 50 ug via INTRAVENOUS

## 2022-07-22 MED ORDER — POTASSIUM CHLORIDE CRYS ER 20 MEQ PO TBCR
40.0000 meq | EXTENDED_RELEASE_TABLET | Freq: Once | ORAL | Status: AC
  Administered 2022-07-22: 40 meq via ORAL
  Filled 2022-07-22: qty 2

## 2022-07-22 MED ORDER — CHLORHEXIDINE GLUCONATE 0.12 % MT SOLN: 15.0000 mL | Freq: Once | OROMUCOSAL | Status: DC

## 2022-07-22 MED ORDER — ONDANSETRON HCL 4 MG/2ML IJ SOLN: 4.0000 mg | Freq: Four times a day (QID) | INTRAMUSCULAR | Status: DC | PRN

## 2022-07-22 MED ORDER — POTASSIUM CHLORIDE 10 MEQ/100ML IV SOLN
10.0000 meq | INTRAVENOUS | Status: DC
  Administered 2022-07-22: 10 meq via INTRAVENOUS
  Filled 2022-07-22 (×3): qty 100

## 2022-07-22 MED ORDER — IOHEXOL 300 MG/ML  SOLN
100.0000 mL | Freq: Once | INTRAMUSCULAR | Status: AC | PRN
  Administered 2022-07-22: 50 mL via INTRA_ARTERIAL

## 2022-07-22 MED ORDER — ALUM & MAG HYDROXIDE-SIMETH 200-200-20 MG/5ML PO SUSP
15.0000 mL | ORAL | Status: DC | PRN
  Administered 2022-07-22: 15 mL via ORAL
  Filled 2022-07-22: qty 30

## 2022-07-22 MED ORDER — ACETAMINOPHEN 160 MG/5ML PO SOLN: 650.0000 mg | ORAL | Status: DC | PRN

## 2022-07-22 MED ORDER — PROPOFOL 10 MG/ML IV BOLUS
INTRAVENOUS | Status: DC | PRN
  Administered 2022-07-22: 200 mg via INTRAVENOUS

## 2022-07-22 MED ORDER — SODIUM CHLORIDE 0.9 % IV SOLN: INTRAVENOUS | Status: DC

## 2022-07-22 MED ORDER — FENTANYL CITRATE (PF) 100 MCG/2ML IJ SOLN
INTRAMUSCULAR | Status: AC
  Filled 2022-07-22: qty 2

## 2022-07-22 MED ORDER — ESMOLOL HCL 100 MG/10ML IV SOLN
INTRAVENOUS | Status: DC | PRN
  Administered 2022-07-22: 50 mg via INTRAVENOUS

## 2022-07-22 MED ORDER — OXYCODONE HCL 5 MG/5ML PO SOLN: 5.0000 mg | Freq: Once | ORAL | Status: DC | PRN

## 2022-07-22 MED ORDER — HEPARIN (PORCINE) 25000 UT/250ML-% IV SOLN
INTRAVENOUS | Status: AC
  Filled 2022-07-22: qty 250

## 2022-07-22 MED ORDER — AMISULPRIDE (ANTIEMETIC) 5 MG/2ML IV SOLN
INTRAVENOUS | Status: AC
  Filled 2022-07-22: qty 4

## 2022-07-22 MED ORDER — ORAL CARE MOUTH RINSE: 15.0000 mL | Freq: Once | OROMUCOSAL | Status: DC

## 2022-07-22 NOTE — Progress Notes (Signed)
Pt stated he has been sleeping good at night w/o cpap, he will let the RN know if later he decides to wear the CPAP.

## 2022-07-22 NOTE — Progress Notes (Signed)
PROGRESS NOTE  Donald Galloway WGN:[id number] DOB: Dec 09, 1965   PCP: Pcp, No  Patient is from: Home  DOA: 07/16/2022 LOS: 6  Chief complaints Chief Complaint  Patient presents with   Altered Mental Status   stroke like symptoms   Headache     Brief Narrative / Interim history: Donald Galloway is a 57 y.o. M with HTN, HLD and OSA who presented with 2 days waxing and waning language disturbance and frontal HA.     In the ER, [facility] showed dural sinus thrombosis, confirmed with CT venogram.  MRI showed small SAH.    4/18: Started on heparin, Neurology consulted 4/19: CTA showed intracranial AVM 4/22: Underwent angiography that confirmed large AVM,  4/24-plan for embolization.  He will be transferred to neuro ICU after embolization.  Subjective: Seen and examined earlier this morning.  No major events overnight of this morning.  No complaints.  Patient's wife at bedside.  Objective: Vitals:   07/22/22 0031 07/22/22 0337 07/22/22 0734 07/22/22 1106  BP: (!) 142/73 105/71 134/79 136/85  Pulse: 68 77 86   Resp: Temp: 98.3 F (36.8 C) 98.2 F (36.8 C) 98.5 F (36.9 C) 98.1 F (36.7 C)  TempSrc: Oral Oral Oral Oral  SpO2: 98% 97% 100% 99%  Weight:      Height:        Examination:  GENERAL: No apparent distress.  Nontoxic. HEENT: MMM.  Vision and hearing grossly intact.  NECK: Supple.  No apparent JVD.  RESP:  No IWOB.  Fair aeration bilaterally. CVS:  RRR. Heart sounds normal.  ABD/GI/GU: BS+. Abd soft, NTND.  MSK/EXT:  Moves extremities. No apparent deformity. No edema.  SKIN: no apparent skin lesion or wound NEURO: Awake, alert and oriented appropriately.  No apparent focal neuro deficit. PSYCH: Calm. Normal affect.   Procedures:  4/24-embolization of intracranial AVM   Assessment and plan: Principal Problem:   Dural sinus thrombosis Active Problems:   SAH (subarachnoid hemorrhage)   AVM (arteriovenous malformation) brain   Essential hypertension    Hyperlipidemia  Dural sinus thrombosis: 2 days waxing and waning language disturbance and frontal HA and found to have dural sinus thrombosis, small SAH and large intracranial AVM.  TTE without significant finding.  LE Doppler negative for DVT. -On IV heparin. -Follow hypercoagulable work up but likely thrombosis is from AVM -Plan for AVM embolization today. -Transfer to neuro ICU after embolization.  PCCM notified. -Appreciate input by neurology and neuro IR.     AVM (arteriovenous malformation) brain -Management as above.   SAH (subarachnoid hemorrhage): Small, also likely due to AVM   Hyperlipidemia -Continue Crestor   Essential hypertension -Continue amlodipine and lisinopril  Hypokalemia: K3.3.  Mg within normal. -IV KCl 10 x 4    Body mass index is 27.49 kg/m.          DVT prophylaxis:  On full dose anticoagulation.  Code Status: Full code Family Communication: Updated patient's wife at bedside Level of care: Med-Surg Status is: Inpatient Remains inpatient appropriate because: Dural sinus thrombosis, intracranial AVM   Final disposition: Likely home once medically stable Consultants:  Neurology Neuro IR  35 minutes with more than 50% spent in reviewing records, counseling patient/family and coordinating care.   Sch Meds:  Scheduled Meds:  [MAR Hold] amLODipine  5 mg Oral Daily   chlorhexidine  15 mL Mouth/Throat Once   Or   mouth rinse  15 mL Mouth Rinse Once   [MAR Hold] lisinopril  20 mg Oral Daily   [MAR Hold] mupirocin ointment  1 Application Nasal BID   [MAR Hold] rosuvastatin  20 mg Oral Daily   [MAR Hold] sodium chloride flush  3 mL Intravenous Once   [MAR Hold] traZODone  50 mg Oral QHS   Continuous Infusions:  sodium chloride      ceFAZolin (ANCEF) IV 2 g (07/22/22 0617)   heparin Stopped (07/22/22 0900)   [MAR Hold] potassium chloride 10 mEq (07/22/22 1049)   PRN Meds:.[MAR Hold] acetaminophen **OR** [MAR Hold] acetaminophen  (TYLENOL) oral liquid 160 mg/5 mL **OR** [MAR Hold] acetaminophen, [MAR Hold] hydrALAZINE, [MAR Hold] loratadine, [MAR Hold] mouth rinse, [MAR Hold] polyethylene glycol  Antimicrobials: Anti-infectives (From admission, onward)    Start     Dose/Rate Route Frequency Ordered Stop   07/22/22 0600  ceFAZolin (ANCEF) IVPB 2g/100 mL premix        2 g 200 mL/hr over 30 Minutes Intravenous Every 8 hours 07/21/22 1623          I have personally reviewed the following labs and images: CBC: Recent Labs  Lab 07/16/22 1017 07/16/22 1034 07/18/22 0118 07/19/22 0305 07/20/22 0453 07/21/22 0445 07/22/22 0332  WBC 5.9   < > 8.3 6.8 7.2 6.2 6.6  NEUTROABS 3.4  --   --   --   --   --  2.7  HGB 15.2   < > 15.6 13.6 13.6 13.8 14.3  HCT 43.4   < > 44.4 39.6 39.7 38.6* 40.8  MCV 86.1   < > 87.1 87.6 87.4 83.9 86.3  PLT 281   < > 268 234 224 220 238   < > = values in this interval not displayed.   BMP &GFR Recent Labs  Lab 07/16/22 1017 07/16/22 1034 07/22/22 0332  NA 137 138 138  K 4.0 4.1 3.3*  CL 101 101 104  CO2 26  --  24  GLUCOSE 108* 105* 91  BUN CREATININE 1.07 1.00 0.89  CALCIUM 9.6  --  8.8*  MG  --   --  2.1   Estimated Creatinine Clearance: 92.6 mL/min (by C-G formula based on SCr of 0.89 mg/dL). Liver & Pancreas: Recent Labs  Lab 07/16/22 1017  AST 26  ALT 37  ALKPHOS 48  BILITOT 1.0  PROT 7.1  ALBUMIN 4.3   No results for input(s): "LIPASE", "AMYLASE" in the last 168 hours. No results for input(s): "AMMONIA" in the last 168 hours. Diabetic: No results for input(s): "HGBA1C" in the last 72 hours. No results for input(s): "GLUCAP" in the last 168 hours. Cardiac Enzymes: No results for input(s): "CKTOTAL", "CKMB", "CKMBINDEX", "TROPONINI" in the last 168 hours. No results for input(s): "PROBNP" in the last 8760 hours. Coagulation Profile: Recent Labs  Lab 07/16/22 1017 07/20/22 0453 07/22/22 0332  INR 1.0 1.1 1.1   Thyroid Function Tests: No  results for input(s): "TSH", "T4TOTAL", "FREET4", "T3FREE", "THYROIDAB" in the last 72 hours. Lipid Profile: No results for input(s): "CHOL", "HDL", "LDLCALC", "TRIG", "CHOLHDL", "LDLDIRECT" in the last 72 hours. Anemia Panel: No results for input(s): "VITAMINB12", "FOLATE", "FERRITIN", "TIBC", "IRON", "RETICCTPCT" in the last 72 hours. Urine analysis: No results found for: "COLORURINE", "APPEARANCEUR", "LABSPEC", "PHURINE", "GLUCOSEU", "HGBUR", "BILIRUBINUR", "KETONESUR", "PROTEINUR", "UROBILINOGEN", "NITRITE", "LEUKOCYTESUR" Sepsis Labs: Invalid input(s): "PROCALCITONIN", "LACTICIDVEN"  Microbiology: Recent Results (from the past 240 hour(s))  Surgical PCR screen     Status: Abnormal   Collection Time: 07/21/22  5:57 PM   Specimen: Nasal Mucosa;  Nasal Swab  Result Value Ref Range Status   MRSA, PCR NEGATIVE NEGATIVE Final   Staphylococcus aureus POSITIVE (A) NEGATIVE Final    Comment: (NOTE) The Xpert SA Assay (FDA approved for NASAL specimens in patients 30 years of age and older), is one component of a comprehensive surveillance program. It is not intended to diagnose infection nor to guide or monitor treatment. Performed at [facility], [address] [place], [place] [address]     Radiology Studies: No results found.    Shonteria Abeln T. Shykeem [employer] [facility]  If 7PM-7AM, please contact night-coverage [email or web address] 07/22/2022, 12:38 PM

## 2022-07-22 NOTE — Progress Notes (Addendum)
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  Patient is scheduled for endovascular fistula treatment today under general anesthesia.   Subjective:  Scheduled today in NIR for procedure of Embolization with Dr Ainsley Spinner Doing well this am No complaints; denies N/V Denies speech or vision changes Denies numbness/tingling Cognition is wnl  Allergies: Zolpidem tartrate  Medications: Prior to Admission medications   Medication Sig Start Date End Date Taking? Authorizing Provider  lisinopril (PRINIVIL,ZESTRIL) 40 MG tablet Take 20 mg by mouth daily. Pt stated he takes 1/2 tablet everyday since a year ago.   Yes [provider]  loratadine (CLARITIN) 10 MG tablet Take 10 mg by mouth daily as needed for allergies.   Yes [provider]  rosuvastatin (CRESTOR) 5 MG tablet Take 5 mg by mouth daily.   Yes [provider]  traZODone (DESYREL) 100 MG tablet Take 50 mg by mouth at bedtime. Pt stated he took 1/2 tablet the night of 07/15/22   Yes [provider]  amLODipine (NORVASC) 10 MG tablet Take 10 mg by mouth daily. Patient not taking: Reported on 07/16/2022    [provider]  aspirin 81 MG tablet Take 81 mg by mouth daily. Patient not taking: Reported on 07/16/2022    [provider]  valACYclovir (VALTREX) 1000 MG tablet Take 1,000 mg by mouth 2 (two) times daily. Patient not taking: Reported on 07/16/2022    [provider]     Vital Signs: BP 134/79 (BP Location: Right Arm)   Pulse 86   Temp 98.5 F (36.9 C) (Oral)   Resp 16   Ht  (1.702 m)   Wt 175 lb 7.8 oz  (79.6 kg)   SpO2 100%   BMI 27.49 kg/m   Physical Exam Vitals reviewed.  HENT:     Mouth/Throat:     Mouth: Mucous membranes are moist.  Eyes:     Extraocular Movements: Extraocular movements intact.  Cardiovascular:     Rate and Rhythm: Normal rate and regular rhythm.     Heart sounds: No murmur heard. Pulmonary:     Effort: Pulmonary effort is normal.     Breath sounds: Normal breath sounds.  Abdominal:     General: Bowel sounds are normal.     Palpations: Abdomen is soft.     Tenderness: There is no abdominal tenderness.  Musculoskeletal:        General: No swelling or tenderness. Normal range of motion.     Cervical back: Normal range of motion.  Skin:    General: Skin is warm.  Neurological:     Mental Status: He is alert and oriented to person, place, and time.  Psychiatric:        Behavior: Behavior normal.     Imaging: IR US Guide Vasc Access Right  Result Date: 07/20/2022 INDICATION: 57 year old male presenting with fluctuating levels of altered mental status since 07/15/2022. 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 He comes to our service today to evaluate cross-sectional imaging findings. EXAM: ULTRASOUND-GUIDED VASCULAR ACCESS DIAGNOSTIC CEREBRAL ANGIOGRAM COMPARISON:  CTA  head and neck July 17, 2022; MRA July 16, 2022. MEDICATIONS: No antibiotic was administered. ANESTHESIA/SEDATION: Moderate (conscious) sedation was employed during this procedure. A total of Versed 1.5 mg and Fentanyl 20 mcg was administered intravenously by the radiology nurse. Total intra-service moderate Sedation Time: 54 minutes. The patient's level of consciousness and vital signs were monitored continuously by radiology nursing throughout the procedure under my direct supervision. CONTRAST:  108 mL of Omnipaque 300 milligram/mL FLUOROSCOPY: Radiation Exposure Index (as provided by the fluoroscopic  device): Peak skin dose 1,277 mGy Kerma COMPLICATIONS: None immediate. TECHNIQUE: Informed written consent was obtained from the patient after a thorough discussion of the procedural risks, benefits and alternatives. All questions were addressed. Maximal Sterile Barrier Technique was utilized including caps, mask, sterile gowns, sterile gloves, sterile drape, hand hygiene and skin antiseptic. A timeout was performed prior to the initiation of the procedure. Using the modified Seldinger technique and a micropuncture kit, access was gained to the distal right radial artery at the anatomical snuffbox and a 5 French sheath was placed. Real-time ultrasound guidance was utilized for vascular access including the acquisition of a permanent ultrasound image documenting patency of the accessed vessel. Slow intra arterial infusion of 5,000 IU heparin, 5 mg Verapamil and 200 mcg nitroglycerin diluted in patient's own blood was performed. No significant fluctuation in patient's blood pressure seen. Then, a right radial artery angiogram was obtained via sheath side port. Normal brachial artery branching pattern seen. No significant anatomical variation. The right radial artery caliber is adequate for vascular access. Next, a 5 [place] Simmons 2 glide catheter was navigated over a 0.035" Terumo Glidewire into the right subclavian artery under fluoroscopic guidance and subsequently into the right common carotid artery. Frontal and lateral angiograms of the neck were obtained. Using biplane roadmap guidance, the catheter was placed into the right internal carotid artery. Frontal and lateral angiograms of the head were obtained. The catheter was retracted into the right common carotid artery. Using biplane roadmap guidance, the catheter was placed into the right external carotid artery. Frontal and lateral angiograms of the head were obtained. Next, the catheter was placed into the left subclavian artery. Using roadmap guidance, the  catheter was advanced into the left vertebral artery. Frontal and lateral angiograms of the head were obtained. Subsequently, the catheter was placed into the left common carotid artery. Frontal and lateral angiograms of the neck were obtained. Using biplane roadmap guidance, the catheter was placed into the left internal carotid artery. Frontal and lateral angiograms of the head were obtained. The catheter was then retracted into the left common carotid artery. Using biplane roadmap guidance, the catheter was advanced into the left external carotid artery. Frontal and lateral angiograms of the head were obtained. Then, the catheter was placed into the left occipital artery. Angiograms with frontal, lateral, magnified left anterior oblique and magnified shoulders view of the head were obtained. The catheter was subsequently withdrawn. An inflatable band was placed and inflated over the right hand access site. The vascular sheath was withdrawn and the band was slowly deflated until brisk flow was noted through the arteriotomy site. At this point, the band was reinflated with additional 2 cc of air to obtain patent hemostasis. FINDINGS: Right radial artery ultrasound and right radial artery angiogram: The caliber of the distal right radial artery is appropriate for angiogram access. The right radial artery and the right ulnar artery have normal course and caliber. No significant anatomical variants noted. Right CCA angiograms: Cervical angiograms show  normal course and caliber of the visualized right common carotid and internal carotid arteries. There are no significant stenoses. Right ICA angiograms: There is brisk vascular contrast filling of the right ACA and MCA vascular trees. Luminal caliber is smooth and tapering. No aneurysms or abnormally high-flow, early draining veins are seen. No regions of abnormal hypervascularity are noted. No opacification of the left transverse and sigmoid sinuses. Other dural sinuses  are patent. Right ECA angiograms: No early venous drainage was noted. The visualized branches of the right external carotid artery are unremarkable. Left vertebral artery angiograms: The left vertebral artery, basilar artery, and bilateral posterior cerebral arteries are unremarkable. Luminal caliber is smooth and tapering. No aneurysms or abnormally high-flow, early draining veins are seen. No regions of abnormal hypervascularity are noted. No opacification of the left transverse and sigmoid sinuses. Other dural sinuses are patent. Dilated left occipital and cerebellar veins are noted, suggesting venous hypertension. Left CCA angiograms: Cervical angiograms show normal course and caliber of the visualized left common carotid and internal carotid arteries. There are no significant stenoses. Left ICA angiograms: There is brisk vascular contrast filling of the left ACA and MCA vascular trees. Luminal caliber is smooth and tapering. No aneurysms or abnormally high-flow, early draining veins are seen. No regions of abnormal hypervascularity are noted. No opacification of the left transverse and sigmoid sinuses. Other dural sinuses are patent. Evidence of dilated veins along the left cerebral hemisphere, suggesting venous hypertension. Left ECA and selective left occipital artery angiograms: Early venous drainage related to dural AV fistula at the left transverse-sigmoid sinus junction. The facial a supplied by the petrosal and petrosquamosal branch from the left middle meningeal artery, and branches of the left occipital artery. The facial drains into a small patent segment of the sinus at the left transverse-sigmoid junction and into the vein of Labbe. There is cortical venous reflux and ectasias. 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 PROCEDURE: No intervention performed. 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 PLAN: Findings discussed with the patient and Dr. Pearlean Brownie. Fistula embolization planned for July 21, 2020. Electronically Signed   By: Baldemar Lenis M.D.   On: 07/20/2022 14:37   IR ANGIO INTRA EXTRACRAN SEL INTERNAL CAROTID BILAT MOD SED  Result Date: 07/20/2022 INDICATION: 57 year old male presenting with fluctuating levels of altered mental status since 07/15/2022. 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 He comes to our service today to evaluate cross-sectional imaging findings. EXAM: ULTRASOUND-GUIDED VASCULAR ACCESS DIAGNOSTIC CEREBRAL ANGIOGRAM COMPARISON:  CTA head and neck July 17, 2022; MRA July 16, 2022. MEDICATIONS: No antibiotic was administered. ANESTHESIA/SEDATION: Moderate (conscious) sedation was employed during this procedure. A total of Versed 1.5 mg and Fentanyl 20 mcg was administered intravenously by the radiology nurse. Total intra-service moderate Sedation Time: 54 minutes. The patient's level of consciousness and vital signs were monitored continuously by radiology nursing throughout the procedure under my direct supervision. CONTRAST:  108 mL of Omnipaque 300 milligram/mL FLUOROSCOPY: Radiation Exposure Index (as provided by the fluoroscopic device): Peak skin dose 1,277 mGy Kerma COMPLICATIONS: None immediate. TECHNIQUE: Informed written consent was obtained from the patient after a thorough discussion of the procedural risks, benefits and alternatives. All questions were addressed.  Maximal Sterile Barrier Technique was utilized including caps, mask, sterile gowns, sterile gloves, sterile drape, hand hygiene and skin antiseptic. A timeout was performed prior to the initiation of the procedure. Using the modified Seldinger technique and a micropuncture kit, access was  gained to the distal right radial artery at the anatomical snuffbox and a 5 French sheath was placed. Real-time ultrasound guidance was utilized for vascular access including the acquisition of a permanent ultrasound image documenting patency of the accessed vessel. Slow intra arterial infusion of 5,000 IU heparin, 5 mg Verapamil and 200 mcg nitroglycerin diluted in patient's own blood was performed. No significant fluctuation in patient's blood pressure seen. Then, a right radial artery angiogram was obtained via sheath side port. Normal brachial artery branching pattern seen. No significant anatomical variation. The right radial artery caliber is adequate for vascular access. Next, a 5 [place] Simmons 2 glide catheter was navigated over a 0.035" Terumo Glidewire into the right subclavian artery under fluoroscopic guidance and subsequently into the right common carotid artery. Frontal and lateral angiograms of the neck were obtained. Using biplane roadmap guidance, the catheter was placed into the right internal carotid artery. Frontal and lateral angiograms of the head were obtained. The catheter was retracted into the right common carotid artery. Using biplane roadmap guidance, the catheter was placed into the right external carotid artery. Frontal and lateral angiograms of the head were obtained. Next, the catheter was placed into the left subclavian artery. Using roadmap guidance, the catheter was advanced into the left vertebral artery. Frontal and lateral angiograms of the head were obtained. Subsequently, the catheter was placed into the left common carotid artery. Frontal and lateral angiograms of the neck were obtained. Using biplane roadmap guidance, the catheter was placed into the left internal carotid artery. Frontal and lateral angiograms of the head were obtained. The catheter was then retracted into the left common carotid artery. Using biplane roadmap guidance, the catheter was advanced into the  left external carotid artery. Frontal and lateral angiograms of the head were obtained. Then, the catheter was placed into the left occipital artery. Angiograms with frontal, lateral, magnified left anterior oblique and magnified shoulders view of the head were obtained. The catheter was subsequently withdrawn. An inflatable band was placed and inflated over the right hand access site. The vascular sheath was withdrawn and the band was slowly deflated until brisk flow was noted through the arteriotomy site. At this point, the band was reinflated with additional 2 cc of air to obtain patent hemostasis. FINDINGS: Right radial artery ultrasound and right radial artery angiogram: The caliber of the distal right radial artery is appropriate for angiogram access. The right radial artery and the right ulnar artery have normal course and caliber. No significant anatomical variants noted. Right CCA angiograms: Cervical angiograms show normal course and caliber of the visualized right common carotid and internal carotid arteries. There are no significant stenoses. Right ICA angiograms: There is brisk vascular contrast filling of the right ACA and MCA vascular trees. Luminal caliber is smooth and tapering. No aneurysms or abnormally high-flow, early draining veins are seen. No regions of abnormal hypervascularity are noted. No opacification of the left transverse and sigmoid sinuses. Other dural sinuses are patent. Right ECA angiograms: No early venous drainage was noted. The visualized branches of the right external carotid artery are unremarkable. Left vertebral artery angiograms: The left vertebral artery, basilar artery, and bilateral posterior cerebral arteries are unremarkable. Luminal caliber is smooth and tapering. No aneurysms or abnormally  high-flow, early draining veins are seen. No regions of abnormal hypervascularity are noted. No opacification of the left transverse and sigmoid sinuses. Other dural sinuses are  patent. Dilated left occipital and cerebellar veins are noted, suggesting venous hypertension. Left CCA angiograms: Cervical angiograms show normal course and caliber of the visualized left common carotid and internal carotid arteries. There are no significant stenoses. Left ICA angiograms: There is brisk vascular contrast filling of the left ACA and MCA vascular trees. Luminal caliber is smooth and tapering. No aneurysms or abnormally high-flow, early draining veins are seen. No regions of abnormal hypervascularity are noted. No opacification of the left transverse and sigmoid sinuses. Other dural sinuses are patent. Evidence of dilated veins along the left cerebral hemisphere, suggesting venous hypertension. Left ECA and selective left occipital artery angiograms: Early venous drainage related to dural AV fistula at the left transverse-sigmoid sinus junction. The facial a supplied by the petrosal and petrosquamosal branch from the left middle meningeal artery, and branches of the left occipital artery. The facial drains into a small patent segment of the sinus at the left transverse-sigmoid junction and into the vein of Labbe. There is cortical venous reflux and ectasias. 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 PROCEDURE: No intervention performed. 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 PLAN: Findings discussed with the patient and Dr. Pearlean Brownie. Fistula embolization planned for July 21, 2020. Electronically Signed   By: Baldemar Lenis M.D.   On: 07/20/2022 14:37   IR ANGIO EXTERNAL CAROTID SEL EXT CAROTID BILAT MOD SED  Result Date: 07/20/2022 INDICATION: 57 year old male presenting with fluctuating levels of altered mental status since 07/15/2022. Head CT and MRI showed thrombosis of  the left transverse/sigmoid sinus extending to the jugular vein with associated dural AV fistula. He comes to our service today to evaluate cross-sectional imaging findings. EXAM: ULTRASOUND-GUIDED VASCULAR ACCESS DIAGNOSTIC CEREBRAL ANGIOGRAM COMPARISON:  CTA head and neck July 17, 2022; MRA July 16, 2022. MEDICATIONS: No antibiotic was administered. ANESTHESIA/SEDATION: Moderate (conscious) sedation was employed during this procedure. A total of Versed 1.5 mg and Fentanyl 20 mcg was administered intravenously by the radiology nurse. Total intra-service moderate Sedation Time: 54 minutes. The patient's level of consciousness and vital signs were monitored continuously by radiology nursing throughout the procedure under my direct supervision. CONTRAST:  108 mL of Omnipaque 300 milligram/mL FLUOROSCOPY: Radiation Exposure Index (as provided by the fluoroscopic device): Peak skin dose 1,277 mGy Kerma COMPLICATIONS: None immediate. TECHNIQUE: Informed written consent was obtained from the patient after a thorough discussion of the procedural risks, benefits and alternatives. All questions were addressed. Maximal Sterile Barrier Technique was utilized including caps, mask, sterile gowns, sterile gloves, sterile drape, hand hygiene and skin antiseptic. A timeout was performed prior to the initiation of the procedure. Using the modified Seldinger technique and a micropuncture kit, access was gained to the distal right radial artery at the anatomical snuffbox and a 5 French sheath was placed. Real-time ultrasound guidance was utilized for vascular access including the acquisition of a permanent ultrasound image documenting patency of the accessed vessel. Slow intra arterial infusion of 5,000 IU heparin, 5 mg Verapamil and 200 mcg nitroglycerin diluted in patient's own blood was performed. No significant fluctuation in patient's blood pressure seen. Then, a right radial artery angiogram was obtained  via sheath side port.  Normal brachial artery branching pattern seen. No significant anatomical variation. The right radial artery caliber is adequate for vascular access. Next, a 5 [place] Simmons 2 glide catheter was navigated over a 0.035" Terumo Glidewire into the right subclavian artery under fluoroscopic guidance and subsequently into the right common carotid artery. Frontal and lateral angiograms of the neck were obtained. Using biplane roadmap guidance, the catheter was placed into the right internal carotid artery. Frontal and lateral angiograms of the head were obtained. The catheter was retracted into the right common carotid artery. Using biplane roadmap guidance, the catheter was placed into the right external carotid artery. Frontal and lateral angiograms of the head were obtained. Next, the catheter was placed into the left subclavian artery. Using roadmap guidance, the catheter was advanced into the left vertebral artery. Frontal and lateral angiograms of the head were obtained. Subsequently, the catheter was placed into the left common carotid artery. Frontal and lateral angiograms of the neck were obtained. Using biplane roadmap guidance, the catheter was placed into the left internal carotid artery. Frontal and lateral angiograms of the head were obtained. The catheter was then retracted into the left common carotid artery. Using biplane roadmap guidance, the catheter was advanced into the left external carotid artery. Frontal and lateral angiograms of the head were obtained. Then, the catheter was placed into the left occipital artery. Angiograms with frontal, lateral, magnified left anterior oblique and magnified shoulders view of the head were obtained. The catheter was subsequently withdrawn. An inflatable band was placed and inflated over the right hand access site. The vascular sheath was withdrawn and the band was slowly deflated until brisk flow was noted through the arteriotomy site. At this point, the band was  reinflated with additional 2 cc of air to obtain patent hemostasis. FINDINGS: Right radial artery ultrasound and right radial artery angiogram: The caliber of the distal right radial artery is appropriate for angiogram access. The right radial artery and the right ulnar artery have normal course and caliber. No significant anatomical variants noted. Right CCA angiograms: Cervical angiograms show normal course and caliber of the visualized right common carotid and internal carotid arteries. There are no significant stenoses. Right ICA angiograms: There is brisk vascular contrast filling of the right ACA and MCA vascular trees. Luminal caliber is smooth and tapering. No aneurysms or abnormally high-flow, early draining veins are seen. No regions of abnormal hypervascularity are noted. No opacification of the left transverse and sigmoid sinuses. Other dural sinuses are patent. Right ECA angiograms: No early venous drainage was noted. The visualized branches of the right external carotid artery are unremarkable. Left vertebral artery angiograms: The left vertebral artery, basilar artery, and bilateral posterior cerebral arteries are unremarkable. Luminal caliber is smooth and tapering. No aneurysms or abnormally high-flow, early draining veins are seen. No regions of abnormal hypervascularity are noted. No opacification of the left transverse and sigmoid sinuses. Other dural sinuses are patent. Dilated left occipital and cerebellar veins are noted, suggesting venous hypertension. Left CCA angiograms: Cervical angiograms show normal course and caliber of the visualized left common carotid and internal carotid arteries. There are no significant stenoses. Left ICA angiograms: There is brisk vascular contrast filling of the left ACA and MCA vascular trees. Luminal caliber is smooth and tapering. No aneurysms or abnormally high-flow, early draining veins are seen. No regions of abnormal hypervascularity are noted. No  opacification of the left transverse and sigmoid sinuses. Other dural sinuses are patent. Evidence of  dilated veins along the left cerebral hemisphere, suggesting venous hypertension. Left ECA and selective left occipital artery angiograms: Early venous drainage related to dural AV fistula at the left transverse-sigmoid sinus junction. The facial a supplied by the petrosal and petrosquamosal branch from the left middle meningeal artery, and branches of the left occipital artery. The facial drains into a small patent segment of the sinus at the left transverse-sigmoid junction and into the vein of Labbe. There is cortical venous reflux and ectasias. 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 PROCEDURE: No intervention performed. 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 PLAN: Findings discussed with the patient and Dr. Pearlean Brownie. Fistula embolization planned for July 21, 2020. Electronically Signed   By: Baldemar Lenis M.D.   On: 07/20/2022 14:37   IR ANGIO VERTEBRAL SEL VERTEBRAL UNI L MOD SED  Result Date: 07/20/2022 INDICATION: 57 year old male presenting with fluctuating levels of altered mental status since 07/15/2022. 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 He comes to our service today to evaluate cross-sectional imaging findings. EXAM: ULTRASOUND-GUIDED VASCULAR ACCESS DIAGNOSTIC CEREBRAL ANGIOGRAM COMPARISON:  CTA head and neck July 17, 2022; MRA July 16, 2022. MEDICATIONS: No antibiotic was administered. ANESTHESIA/SEDATION: Moderate (conscious) sedation was employed during this procedure. A total of Versed 1.5 mg and Fentanyl 20 mcg was administered intravenously by the radiology nurse.  Total intra-service moderate Sedation Time: 54 minutes. The patient's level of consciousness and vital signs were monitored continuously by radiology nursing throughout the procedure under my direct supervision. CONTRAST:  108 mL of Omnipaque 300 milligram/mL FLUOROSCOPY: Radiation Exposure Index (as provided by the fluoroscopic device): Peak skin dose 1,277 mGy Kerma COMPLICATIONS: None immediate. TECHNIQUE: Informed written consent was obtained from the patient after a thorough discussion of the procedural risks, benefits and alternatives. All questions were addressed. Maximal Sterile Barrier Technique was utilized including caps, mask, sterile gowns, sterile gloves, sterile drape, hand hygiene and skin antiseptic. A timeout was performed prior to the initiation of the procedure. Using the modified Seldinger technique and a micropuncture kit, access was gained to the distal right radial artery at the anatomical snuffbox and a 5 French sheath was placed. Real-time ultrasound guidance was utilized for vascular access including the acquisition of a permanent ultrasound image documenting patency of the accessed vessel. Slow intra arterial infusion of 5,000 IU heparin, 5 mg Verapamil and 200 mcg nitroglycerin diluted in patient's own blood was performed. No significant fluctuation in patient's blood pressure seen. Then, a right radial artery angiogram was obtained via sheath side port. Normal brachial artery branching pattern seen. No significant anatomical variation. The right radial artery caliber is adequate for vascular access. Next, a 5 [place] Simmons 2 glide catheter was navigated over a 0.035" Terumo Glidewire into the right subclavian artery under fluoroscopic guidance and subsequently into the right common carotid artery. Frontal and lateral angiograms of the neck were obtained. Using biplane roadmap guidance, the catheter was placed into the right internal carotid artery. Frontal and lateral angiograms of the  head were obtained. The catheter was retracted into the right common carotid artery. Using biplane roadmap guidance, the catheter was placed into the right external carotid artery. Frontal and lateral angiograms of the head were obtained.  Next, the catheter was placed into the left subclavian artery. Using roadmap guidance, the catheter was advanced into the left vertebral artery. Frontal and lateral angiograms of the head were obtained. Subsequently, the catheter was placed into the left common carotid artery. Frontal and lateral angiograms of the neck were obtained. Using biplane roadmap guidance, the catheter was placed into the left internal carotid artery. Frontal and lateral angiograms of the head were obtained. The catheter was then retracted into the left common carotid artery. Using biplane roadmap guidance, the catheter was advanced into the left external carotid artery. Frontal and lateral angiograms of the head were obtained. Then, the catheter was placed into the left occipital artery. Angiograms with frontal, lateral, magnified left anterior oblique and magnified shoulders view of the head were obtained. The catheter was subsequently withdrawn. An inflatable band was placed and inflated over the right hand access site. The vascular sheath was withdrawn and the band was slowly deflated until brisk flow was noted through the arteriotomy site. At this point, the band was reinflated with additional 2 cc of air to obtain patent hemostasis. FINDINGS: Right radial artery ultrasound and right radial artery angiogram: The caliber of the distal right radial artery is appropriate for angiogram access. The right radial artery and the right ulnar artery have normal course and caliber. No significant anatomical variants noted. Right CCA angiograms: Cervical angiograms show normal course and caliber of the visualized right common carotid and internal carotid arteries. There are no significant stenoses. Right ICA  angiograms: There is brisk vascular contrast filling of the right ACA and MCA vascular trees. Luminal caliber is smooth and tapering. No aneurysms or abnormally high-flow, early draining veins are seen. No regions of abnormal hypervascularity are noted. No opacification of the left transverse and sigmoid sinuses. Other dural sinuses are patent. Right ECA angiograms: No early venous drainage was noted. The visualized branches of the right external carotid artery are unremarkable. Left vertebral artery angiograms: The left vertebral artery, basilar artery, and bilateral posterior cerebral arteries are unremarkable. Luminal caliber is smooth and tapering. No aneurysms or abnormally high-flow, early draining veins are seen. No regions of abnormal hypervascularity are noted. No opacification of the left transverse and sigmoid sinuses. Other dural sinuses are patent. Dilated left occipital and cerebellar veins are noted, suggesting venous hypertension. Left CCA angiograms: Cervical angiograms show normal course and caliber of the visualized left common carotid and internal carotid arteries. There are no significant stenoses. Left ICA angiograms: There is brisk vascular contrast filling of the left ACA and MCA vascular trees. Luminal caliber is smooth and tapering. No aneurysms or abnormally high-flow, early draining veins are seen. No regions of abnormal hypervascularity are noted. No opacification of the left transverse and sigmoid sinuses. Other dural sinuses are patent. Evidence of dilated veins along the left cerebral hemisphere, suggesting venous hypertension. Left ECA and selective left occipital artery angiograms: Early venous drainage related to dural AV fistula at the left transverse-sigmoid sinus junction. The facial a supplied by the petrosal and petrosquamosal branch from the left middle meningeal artery, and branches of the left occipital artery. The facial drains into a small patent segment of the sinus at  the left transverse-sigmoid junction and into the vein of Labbe. There is cortical venous reflux and ectasias. 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 PROCEDURE: No intervention performed. 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 PLAN: Findings discussed with the patient and Dr. Pearlean Brownie. Fistula embolization planned for July 21, 2020. Electronically Signed   By: Baldemar Lenis M.D.   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 The following electrodes were missing or displaced: none. Background: The background consists of intermixed alpha and beta activities. There is a well defined posterior dominant rhythm of 9-10 hz that attenuates with eye opening. Sleep is very briefly recorded. Photic stimulation: Physiologic driving is present EEG Abnormalities: None Clinical Interpretation: This normal EEG is recorded in the waking and sleep state. There was no seizure or seizure predisposition recorded on this study. Please note that lack of epileptiform activity on EEG does not preclude the possibility of epilepsy. Donald Slot, MD [facility] [phone] If 7pm- 7am, please page neurology on call as listed in AMION.    Labs:  CBC: Recent Labs    07/19/22 0305 07/20/22 0453 07/21/22 0445 07/22/22 0332  WBC 6.8 7.2 6.2 6.6  HGB 13.6 13.6 13.8 14.3  HCT 39.6 39.7 38.6* 40.8  PLT 234 224 220 238     COAGS: Recent Labs    07/16/22 1017 07/20/22 0453 07/22/22 0332  INR 1.0 1.1 1.1  APTT 27  --   --     BMP: Recent Labs    07/16/22 1017 07/16/22 1034 07/22/22 0332  NA 137 138 138  K 4.0 4.1 3.3*  CL 101 101 104  CO2 26  --  24  GLUCOSE 108* 105* 91  BUN 15 18 10   CALCIUM 9.6  --  8.8*  CREATININE 1.07 1.00 0.89  GFRNONAA >60  --  >60    LIVER FUNCTION TESTS: Recent Labs    07/16/22 1017  BILITOT 1.0  AST 26  ALT 37  ALKPHOS 48  PROT 7.1  ALBUMIN 4.3    Assessment and Plan:  Scheduled today for Embolization: 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  This interventional procedure involves the use of X-rays and because of the nature of the planned procedure, it is possible that we will have prolonged use of X-ray fluoroscopy.  Potential radiation risks to you include (but are not limited to) the following: - A slightly elevated risk for cancer  several years later in life. This risk is typically less than 0.5% percent. This risk is low in comparison to the normal incidence of human cancer, which is 33% for women and 50% for men according to the [employer]. - Radiation induced injury can include skin redness, resembling a rash, tissue breakdown / ulcers and hair loss (which can be temporary or permanent).   The likelihood of  either of these occurring depends on the difficulty of the procedure and whether you are sensitive to radiation due to previous procedures, disease, or genetic conditions.   IF your procedure  requires a prolonged use of radiation, you will be notified and given written instructions for further action.  It is your responsibility to monitor the irradiated area for the 2 weeks following the procedure and to notify your physician if you are concerned that you have suffered a radiation induced injury.    All of the patient's questions were answered, patient is agreeable to proceed.  Consent signed and in chart  Electronically Signed: Robet Leu, PA-C 07/22/2022, 8:53 AM   I spent a total of 25 Minutes at the the patient's bedside AND on the patient's hospital floor or unit, greater than 50% of which was counseling/coordinating care for cerbral arteriogram with intervention

## 2022-07-22 NOTE — Anesthesia Procedure Notes (Signed)
Arterial Line Insertion Start/End4/24/2024 11:48 AM, 07/22/2022 11:50 AM Performed by: Rachel Moulds, CRNA, CRNA  Patient location: Pre-op. Preanesthetic checklist: patient identified, IV checked, site marked, risks and benefits discussed, surgical consent, monitors and equipment checked, pre-op evaluation, timeout performed and anesthesia consent Lidocaine 1% used for infiltration Left, radial was placed Catheter size: 20 G Hand hygiene performed  and maximum sterile barriers used  Allen's test indicative of satisfactory collateral circulation Attempts: 3 Procedure performed without using ultrasound guided technique. Following insertion, dressing applied and Biopatch. Post procedure assessment: normal  Post procedure complications: local hematoma. Patient tolerated the procedure well with no immediate complications.

## 2022-07-22 NOTE — Anesthesia Procedure Notes (Signed)
Procedure Name: Intubation Date/Time: 07/22/2022 1:05 PM  Performed by: De Nurse, CRNAPre-anesthesia Checklist: Patient identified, Emergency Drugs available, Suction available and Patient being monitored Patient Re-evaluated:Patient Re-evaluated prior to induction Oxygen Delivery Method: Circle System Utilized Preoxygenation: Pre-oxygenation with 100% oxygen Induction Type: IV induction Ventilation: Mask ventilation without difficulty Laryngoscope Size: Mac and 4 Grade View: Grade I Tube type: Oral Tube size: 7.5 mm Number of attempts: 1 Airway Equipment and Method: Stylet and Oral airway Placement Confirmation: ETT inserted through vocal cords under direct vision, positive ETCO2 and breath sounds checked- equal and bilateral Secured at: 22 cm Tube secured with: Tape Dental Injury: Teeth and Oropharynx as per pre-operative assessment

## 2022-07-22 NOTE — Transfer of Care (Signed)
Immediate Anesthesia Transfer of Care Note  Patient: Donald Galloway  Procedure(s) Performed: Embolization  Patient Location: PACU  Anesthesia Type:General  Level of Consciousness: awake and alert   Airway & Oxygen Therapy: Patient Spontanous Breathing and Patient connected to face mask oxygen  Post-op Assessment: Report given to RN, Post -op Vital signs reviewed and stable, Patient moving all extremities X 4, and Patient able to stick tongue midline  Post vital signs: Reviewed and stable  Last Vitals:  Vitals Value Taken Time  BP 104/65 07/22/22 1900  Temp    Pulse 88 07/22/22 1902  Resp 15 07/22/22 1902  SpO2 96 % 07/22/22 1902  Vitals shown include unvalidated device data.  Last Pain:  Vitals:   07/22/22 1114  TempSrc:   PainSc: 0-No pain      Patients Stated Pain Goal: 0 (07/16/22 2017)  Complications: No notable events documented.

## 2022-07-22 NOTE — Anesthesia Postprocedure Evaluation (Signed)
Anesthesia Post Note  Patient: Donald Galloway  Procedure(s) Performed: Embolization     Patient location during evaluation: PACU Anesthesia Type: General Level of consciousness: awake and alert Pain management: pain level controlled Vital Signs Assessment: post-procedure vital signs reviewed and stable Respiratory status: spontaneous breathing, nonlabored ventilation, respiratory function stable and patient connected to nasal cannula oxygen Cardiovascular status: blood pressure returned to baseline and stable Postop Assessment: no apparent nausea or vomiting Anesthetic complications: no   No notable events documented.  Last Vitals:  Vitals:   07/22/22 2100 07/22/22 2115  BP: 111/65   Pulse: 86 80  Resp: 16 18  Temp:    SpO2: 97% 96%    Last Pain:  Vitals:   07/22/22 2049  TempSrc:   PainSc: 0-No pain                 Nelle Don Verenise Moulin

## 2022-07-22 NOTE — Progress Notes (Signed)
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  eICU Interventions  New Patient Evaluation.        Migdalia Dk 07/22/2022, 8:54 PM

## 2022-07-22 NOTE — Progress Notes (Signed)
eLink Physician-Brief Progress Note Patient Name: Donald Galloway DOB: 1966/02/14 MRN: [id number]   Date of Service  07/22/2022  HPI/Events of Note  Patient complaining of  dyspepsia.  eICU Interventions  PRN Maalox ordered.        Migdalia Dk 07/22/2022, 11:25 PM

## 2022-07-22 NOTE — Progress Notes (Signed)
MD Chestine Spore at bedside. Current order to advance diet as tolerated from NPO to regular. Diet advanced to regular, and verbal order from Chestine Spore, MD to discontinue the remaining 3 runs of potassium and switch to 40 mEq of PO potassium once.

## 2022-07-22 NOTE — Progress Notes (Signed)
Patient on heparin drip getting a procedure today scheduled for 1130 am ( Embolization ). Provider was contacted to clarify at what time should the heparin gtt be stopped. Day team provider did not provided this information in his notes. Night provider did not know when to stopped heparin gtt and suggested to contact IR for further information. Attempted to get in touch with IR but no one is available until 0730-0800 am. Provider was made aware of unsuccessful attempts made to contact IR, and that this information will be passed to day shift RN for additional follow-up in the morning. Provider is ok with that and states "usually we hold it 1-2 hours prior to procedure, so timing is good. We're good."   Will let day shift RN about this for he/her to follow-up.

## 2022-07-22 NOTE — Consult Note (Signed)
NAME:  Donald Galloway, MRN:  [id number], DOB:  17-Jun-1965, LOS: 6 ADMISSION DATE:  07/16/2022, CONSULTATION DATE:  07/22/22 REFERRING MD:  Maryln Gottron, CHIEF COMPLAINT:  headache   History of Present Illness:  57 year old male with PMH of HLD, HTN, and OSA who presented on 4/18 after 2 day history of fluctuating altered mental status and language issues with frontal headache.  Workup revealing for dural sinus thrombosis, confirmed with CT venogram.  MRI showed small SAH.  Admitted to [facility] with neurology consulting.  Started on heparin gtt.  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  NIR consulted, underwent angiography which confirmed large AVM.  Patient taken for embolization on 4/24 with Dr. Sherlon Handing.  PCCM consulted for medical management following postprocedure in Neuro ICU.   Pertinent  Medical History  HTN, HLD, OSA on CPAP   Significant Hospital Events: Including procedures, antibiotic start and stop dates in addition to other pertinent events   4/18: admitted, started on heparin, Neurology consulted 4/19: CTA showed intracranial AVM 4/22: Underwent angiography that confirmed large AVM 4/24: NIR embolization of AVM  Subjective   Has slight nausea and headache. Just received Zofran and Fentanyl.  Objective   Blood pressure 136/85, pulse 86, temperature 98.1 F (36.7 C), temperature source Oral, resp. rate 20, height  (1.702 m), weight 79.6 kg, SpO2 99 %.        Intake/Output Summary (Last 24 hours) at 07/22/2022 1337 Last data filed at 07/22/2022 0648 Gross per 24 hour  Intake 100 ml  Output --  Net 100 ml   Filed Weights   07/16/22 1003 07/16/22 2017  Weight: 80.7 kg 79.6 kg   Examination: General: Adult male, resting in bed, in NAD. Neuro: A&O x 3, no deficits. HEENT: /AT. Sclerae anicteric. EOMI. Cardiovascular: RRR, no M/R/G.  Lungs: Respirations even and unlabored.  CTA bilaterally, No W/R/R. Abdomen: BS x 4, soft, NT/ND.  Musculoskeletal: No gross deformities, no edema.   Skin: Intact, warm, no rashes.   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  Hypercoagulable workup pending - maintain neuro protective measures; goal for eurothermia, euglycemia, eunatermia, normoxia, and PCO2 goal of 35-40  Nausea and headache - 2/2 above. - Zofran, Fentanyl PRN  OSA on CPAP - Continue nocturnal CPAP  Hx HTN, HLD. - Continue home Amlodipine, Lisinopril, Crestor - SBP goal 100 - 160 per neuro  Hypokalemia - Additional 4 runs K now - BMP in AM   Best Practice (right click and "Reselect all SmartList Selections" daily)   Diet/type: NPO for now, resume diet in AM if no issues post extubation tonight DVT prophylaxis: systemic heparin GI prophylaxis: PPI Lines: N/A Foley:  N/A Code Status:  full code Last date of multidisciplinary goals of care discussion: None yet  Labs   CBC: Recent Labs  Lab 07/16/22 1017 07/16/22 1034 07/18/22 0118 07/19/22 0305 07/20/22 0453 07/21/22 0445 07/22/22 0332  WBC 5.9   < > 8.3 6.8 7.2 6.2 6.6  NEUTROABS 3.4  --   --   --   --   --  2.7  HGB 15.2   < > 15.6 13.6 13.6 13.8 14.3  HCT 43.4   < > 44.4 39.6 39.7 38.6* 40.8  MCV 86.1   < > 87.1 87.6 87.4 83.9 86.3  PLT 281   < > 268 234 224 220 238   < > = values in this interval not displayed.    Basic Metabolic Panel: Recent Labs  Lab 07/16/22 1017 07/16/22 1034 07/22/22 0332  NA 137 138 138  K 4.0 4.1 3.3*  CL 101 101 104  CO2 26  --  24  GLUCOSE 108* 105* 91  BUN CREATININE  1.07 1.00 0.89  CALCIUM 9.6  --  8.8*  MG  --   --  2.1   GFR: Estimated Creatinine Clearance: 92.6 mL/min (by C-G formula based on SCr of 0.89 mg/dL). Recent Labs  Lab 07/19/22 0305 07/20/22 0453 07/21/22 0445 07/22/22 0332  WBC 6.8 7.2 6.2 6.6    Liver Function Tests: Recent Labs  Lab 07/16/22 1017  AST 26  ALT 37  ALKPHOS 48  BILITOT 1.0  PROT 7.1  ALBUMIN 4.3   No results for input(s): "LIPASE", "AMYLASE" in the last 168 hours. No results for input(s): "AMMONIA" in the last 168 hours.  ABG    Component Value Date/Time   TCO2 26 07/16/2022 1034     Coagulation Profile: Recent Labs  Lab 07/16/22 1017 07/20/22 0453 07/22/22 0332  INR 1.0 1.1 1.1    Cardiac Enzymes: No results for input(s): "CKTOTAL", "CKMB", "CKMBINDEX", "TROPONINI" in the last 168 hours.  HbA1C: Hgb A1c MFr Bld  Date/Time Value Ref Range Status  07/16/2022 08:20 PM 5.2 4.8 - 5.6 % Final    Comment:    (NOTE) Pre diabetes:          5.7%-6.4%  Diabetes:              >6.4%  Glycemic control for   <7.0% adults with diabetes     CBG: No results for input(s): "GLUCAP" in the last 168 hours.  Review of Systems:   All negative; except for those that are bolded, which indicate positives.  Constitutional: weight loss, weight gain, night sweats, fevers, chills, fatigue, weakness.  HEENT: headache, sore throat, sneezing, nasal congestion, post nasal drip, difficulty swallowing, tooth/dental problems, visual complaints, visual changes, ear aches. Neuro: difficulty with speech, weakness, numbness, ataxia. CV:  chest pain, orthopnea, PND, swelling in lower extremities, dizziness, palpitations, syncope.  Resp: cough, hemoptysis, dyspnea, wheezing. GI: heartburn, indigestion, abdominal pain, nausea, vomiting, diarrhea, constipation, change in bowel habits, loss of appetite, hematemesis, melena, hematochezia.  GU: dysuria, change in color of urine, urgency or frequency, flank pain,  hematuria. MSK: joint pain or swelling, decreased range of motion. Psych: change in mood or affect, depression, anxiety, suicidal ideations, homicidal ideations. Skin: rash, itching, bruising.   Past Medical History:  He,  has a past medical history of Herpes simplex, Hyperlipidemia, Hypertension, Insomnia, Palpitations, and Sleep apnea.   Surgical History:   Past Surgical History:  Procedure Laterality Date   IR ANGIO EXTERNAL CAROTID SEL EXT CAROTID BILAT MOD SED  07/20/2022   IR ANGIO INTRA EXTRACRAN SEL INTERNAL CAROTID BILAT MOD SED  07/20/2022   IR ANGIO VERTEBRAL SEL VERTEBRAL UNI L MOD SED  07/20/2022   IR US GUIDE VASC ACCESS RIGHT  07/20/2022   NOSE SURGERY     DR SHOEMAKER     Social History:   reports that he has never smoked.  He does not have any smokeless tobacco history on file. He reports current alcohol use. He reports that he does not use drugs.   Family History:  His family history includes Hyperlipidemia in his father and mother; Hypertension in his father.   Allergies Allergies  Allergen Reactions   Zolpidem Tartrate     Leaves him groggy in the AM     Home Medications  Prior to Admission medications   Medication Sig Start Date End Date Taking? Authorizing Provider  lisinopril (PRINIVIL,ZESTRIL) 40 MG tablet Take 20 mg by mouth daily. Pt stated he takes 1/2 tablet everyday since a year ago.   Yes [provider]  loratadine (CLARITIN) 10 MG tablet Take 10 mg by mouth daily as needed for allergies.   Yes [provider]  rosuvastatin (CRESTOR) 5 MG tablet Take 5 mg by mouth daily.   Yes [provider]  traZODone (DESYREL) 100 MG tablet Take 50 mg by mouth at bedtime. Pt stated he took 1/2 tablet the night of 07/15/22   Yes [provider]  amLODipine (NORVASC) 10 MG tablet Take 10 mg by mouth daily. Patient not taking: Reported on 07/16/2022    [provider]  aspirin 81 MG tablet Take 81 mg by mouth  daily. Patient not taking: Reported on 07/16/2022    [provider]  valACYclovir (VALTREX) 1000 MG tablet Take 1,000 mg by mouth 2 (two) times daily. Patient not taking: Reported on 07/16/2022    [provider]     Critical care time: 40 min.    Rutherford Guys, PA - [facility] For pager details, please see AMION or use Epic chat  After 1900, please call [facility] for cross coverage needs 07/22/2022, 8:06 PM

## 2022-07-22 NOTE — Progress Notes (Cosign Needed)
STROKE TEAM PROGRESS NOTE   INTERVAL HISTORY Patient is scheduled for embolization of AVM.Pt not in room gone for procedure. Vital signs stable  Vitals:   07/22/22 0031 07/22/22 0337 07/22/22 0734 07/22/22 1106  BP: (!) 142/73 105/71 134/79 136/85  Pulse: 68 77 86   Resp: Temp: 98.3 F (36.8 C) 98.2 F (36.8 C) 98.5 F (36.9 C) 98.1 F (36.7 C)  TempSrc: Oral Oral Oral Oral  SpO2: 98% 97% 100% 99%  Weight:      Height:       CBC:  Recent Labs  Lab 07/16/22 1017 07/16/22 1034 07/21/22 0445 07/22/22 0332  WBC 5.9   < > 6.2 6.6  NEUTROABS 3.4  --   --  2.7  HGB 15.2   < > 13.8 14.3  HCT 43.4   < > 38.6* 40.8  MCV 86.1   < > 83.9 86.3  PLT 281   < > 220 238   < > = values in this interval not displayed.    Basic Metabolic Panel:  Recent Labs  Lab 07/16/22 1017 07/16/22 1034 07/22/22 0332  NA 137 138 138  K 4.0 4.1 3.3*  CL 101 101 104  CO2 26  --  24  GLUCOSE 108* 105* 91  BUN CREATININE 1.07 1.00 0.89  CALCIUM 9.6  --  8.8*  MG  --   --  2.1    Lipid Panel:  Recent Labs  Lab 07/17/22 0412  CHOL 174  TRIG 98  HDL 55  CHOLHDL 3.2  VLDL 20  LDLCALC 99    HgbA1c:  Recent Labs  Lab 07/16/22 2020  HGBA1C 5.2    Urine Drug Screen:  Recent Labs  Lab 07/16/22 2319  LABOPIA NONE DETECTED  COCAINSCRNUR NONE DETECTED  LABBENZ NONE DETECTED  AMPHETMU NONE DETECTED  THCU NONE DETECTED  LABBARB NONE DETECTED     Alcohol Level  Recent Labs  Lab 07/16/22 1017  ETH <10     IMAGING past 24 hours No results found.  PHYSICAL EXAM**  Temp:  [97.4 F (36.3 C)-98.5 F (36.9 C)] 98.1 F (36.7 C) (04/24 1106) Pulse Rate:  [68-91] 86 (04/24 0734) Resp:  [14-20] 20 (04/24 1106) BP: (105-142)/(71-85) 136/85 (04/24 1106) SpO2:  [97 %-100 %] 99 % (04/24 1106)  General - unable tomexamine as pt busy in NIR procedure  ASSESSMENT/PLAN Donald Galloway is a 57 y.o. male with history of hypertension, hyperlipidemia, and  sleep apnea who presented to the ED 07/16/22 for evaluation of fluctuating levels of altered mental status with onset on 07/15/2022.  The patient states that while he was at work yesterday, he felt that he was having trouble comprehending others as they were speaking to him and at times, he "zoned out" and did not hear their spoken words at all.  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 Linear hyperdensity in the left middle cranial fossa is nonspecific, but could represent a small amount of subarachnoid blood products. 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  2D Echo EF 60 to 65% Venous doppler LE bilaterally for DVT EEG This normal EEG is recorded in the waking and sleep state. There was no seizure or seizure predisposition recorded on this study. Please note that lack of epileptiform activity on EEG does not preclude the possibility of epilepsy.  LDL 99 HgbA1c 5.2 UDS neg Hypercoagulable work up pending  but homocystine and anticardiolipin antibodies are normal VTE prophylaxis -IV heparin 81 mg prior to admission, now on IV heparin.  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 Sherlon Handing from IR DSA dural venous sinus thrombosis and dural AV fistula No angiograms scheduled for 4/24  Hypertension Home meds: lisinopril 20 mg, Stable On lisino 20 and norvasc 5 BP goal<160 Long-term BP goal normotensive  Hyperlipidemia Home meds: Crestor 5 mg LDL 99, goal < 70 On crestor 20 Continue statin at discharge  Other Stroke Risk Factors Obstructive sleep apnea, on CPAP at home  Hospital day # 6  Gevena Mart DNP, ACNPC-AG  [facility]  I have personally obtained history,examined this patient, reviewed notes, independently viewed imaging studies, participated in medical decision making and plan of care.ROS completed by me personally and pertinent positives fully documented  I have made any additions or clarifications directly to the above note. Agree with note above. Attempted to see pt multipletimes in the afternoon but busy in [facility] procedure so unable  Delia Heady, MD Medical Director [facility] Pager: [phone] 07/23/2022 6:05 AM    To contact Stroke Continuity provider, please refer to [email or web address]. After hours, contact General Neurology

## 2022-07-22 NOTE — Procedures (Signed)
INTERVENTIONAL NEURORADIOLOGY BRIEF POSTPROCEDURE NOTE  DIAGNOSTIC ANGIOGRAM AND ENDOVASCULAR DURAL ARTERIOVENOUS FISTULA EMBOLIZATION  Attending: Dr. Baldemar Lenis  Diagnosis: Left transverse/sigmoid sinus dural AV fistula  Access site: RCFA  Access closure: 8 [place] angioseal  Anesthesia: GETA  Medication used: Refer to anesthesia documentation.  Complications: None.  Estimated blood loss: Minimal.  Specimen: None.   Findings: Redemonstrated left transverse/sigmoid sinus dural AV fistula. Endovascular embolization performed with Onyx liquid embolic agent via left MMA and left Occipital artery branches. No residual AV fistula noted at the end of the procedure.  The patient tolerated the procedure well without incident or complication and is in stable condition.    PLAN: - Bed rest x 6 hours post right common femoral artery puncture - SBP 100-160 mmHg - Restart heparin per pharmacy protocol - STAT head CT for any acute neurological decline

## 2022-07-22 NOTE — Anesthesia Preprocedure Evaluation (Addendum)
Anesthesia Evaluation  Patient identified by MRN, date of birth, ID band Patient awake    Reviewed: Allergy & Precautions, NPO status , Patient's Chart, lab work & pertinent test results  Airway Mallampati: II  TM Distance: >3 FB Neck ROM: Full    Dental no notable dental hx.    Pulmonary    Pulmonary exam normal        Cardiovascular hypertension, Pt. on medications  Rhythm:Regular Rate:Normal  1. Left ventricular ejection fraction, by estimation, is 60 to 65%. The  left ventricle has normal function. The left ventricle has no regional  wall motion abnormalities. Left ventricular diastolic parameters were  normal.   2. Right ventricular systolic function is normal. The right ventricular  size is normal.   3. The mitral valve is normal in structure. Trivial mitral valve  regurgitation. No evidence of mitral stenosis.   4. The aortic valve was not well visualized. Aortic valve regurgitation  is not visualized. No aortic stenosis is present.   5. The inferior vena cava is normal in size with greater than 50%  respiratory variability, suggesting right atrial pressure of 3 mmHg.     Neuro/Psych    GI/Hepatic   Endo/Other    Renal/GU      Musculoskeletal   Abdominal Normal abdominal exam  (+)   Peds  Hematology Lab Results      Component                Value               Date                      WBC                      6.6                 07/22/2022                HGB                      14.3                07/22/2022                HCT                      40.8                07/22/2022                MCV                      86.3                07/22/2022                PLT                      238                 07/22/2022              Anesthesia Other Findings   Reproductive/Obstetrics                             Anesthesia Physical Anesthesia Plan  ASA: 3  Anesthesia Plan:  General   Post-op Pain Management: Minimal or no pain anticipated   Induction: Intravenous  PONV Risk Score and Plan: 2 and Ondansetron and Dexamethasone  Airway Management Planned: Oral ETT  Additional Equipment: Arterial line  Intra-op Plan:   Post-operative Plan: Extubation in OR  Informed Consent: I have reviewed the patients History and Physical, chart, labs and discussed the procedure including the risks, benefits and alternatives for the proposed anesthesia with the patient or authorized representative who has indicated his/her understanding and acceptance.     Dental advisory given  Plan Discussed with:   Anesthesia Plan Comments:        Anesthesia Quick Evaluation

## 2022-07-23 ENCOUNTER — Encounter (HOSPITAL_COMMUNITY): Payer: Self-pay | Admitting: Neuroradiology

## 2022-07-23 ENCOUNTER — Other Ambulatory Visit (HOSPITAL_COMMUNITY): Payer: Self-pay

## 2022-07-23 DIAGNOSIS — G08 Intracranial and intraspinal phlebitis and thrombophlebitis: Secondary | ICD-10-CM | POA: Diagnosis not present

## 2022-07-23 DIAGNOSIS — G4733 Obstructive sleep apnea (adult) (pediatric): Secondary | ICD-10-CM

## 2022-07-23 LAB — RENAL FUNCTION PANEL
Albumin: 3.1 g/dL — ABNORMAL LOW (ref 3.5–5.0)
Anion gap: 9 (ref 5–15)
BUN: 8 mg/dL (ref 6–20)
CO2: 20 mmol/L — ABNORMAL LOW (ref 22–32)
Calcium: 8.3 mg/dL — ABNORMAL LOW (ref 8.9–10.3)
Chloride: 110 mmol/L (ref 98–111)
Creatinine, Ser: 0.78 mg/dL (ref 0.61–1.24)
GFR, Estimated: >60 mL/min (ref >60)
Glucose, Bld: 147 mg/dL — ABNORMAL HIGH (ref 70–99)
Phosphorus: 3 mg/dL (ref 2.5–4.6)
Potassium: 3.9 mmol/L (ref 3.5–5.1)
Sodium: 139 mmol/L (ref 135–145)

## 2022-07-23 LAB — DIFFERENTIAL
Abs Immature Granulocytes: 0.03 10*3/uL (ref 0.00–0.07)
Basophils Absolute: 0 10*3/uL (ref 0.0–0.1)
Basophils Relative: 0 %
Eosinophils Absolute: 0 10*3/uL (ref 0.0–0.5)
Eosinophils Relative: 0 %
Immature Granulocytes: 0 %
Lymphocytes Relative: 17 %
Lymphs Abs: 1.4 10*3/uL (ref 0.7–4.0)
Monocytes Absolute: 0.6 10*3/uL (ref 0.1–1.0)
Monocytes Relative: 7 %
Neutro Abs: 6 10*3/uL (ref 1.7–7.7)
Neutrophils Relative %: 76 %

## 2022-07-23 LAB — CBC
HCT: 34.2 % — ABNORMAL LOW (ref 39.0–52.0)
Hemoglobin: 11.8 g/dL — ABNORMAL LOW (ref 13.0–17.0)
MCH: 29.8 pg (ref 26.0–34.0)
MCHC: 34.5 g/dL (ref 30.0–36.0)
MCV: 86.4 fL (ref 80.0–100.0)
Platelets: 213 10*3/uL (ref 150–400)
RBC: 3.96 MIL/uL — ABNORMAL LOW (ref 4.22–5.81)
RDW: 11.4 % — ABNORMAL LOW (ref 11.5–15.5)
WBC: 7.7 10*3/uL (ref 4.0–10.5)
nRBC: 0 % (ref 0.0–0.2)

## 2022-07-23 LAB — HEPARIN LEVEL (UNFRACTIONATED): Heparin Unfractionated: 0.34 IU/mL (ref 0.30–0.70)

## 2022-07-23 LAB — BRAIN NATRIURETIC PEPTIDE: B Natriuretic Peptide: 115.2 pg/mL — ABNORMAL HIGH (ref 0.0–100.0)

## 2022-07-23 LAB — MAGNESIUM: Magnesium: 2 mg/dL (ref 1.7–2.4)

## 2022-07-23 LAB — MRSA NEXT GEN BY PCR, NASAL: MRSA by PCR Next Gen: NOT DETECTED

## 2022-07-23 MED ORDER — NOREPINEPHRINE 4 MG/250ML-% IV SOLN
0.0000 ug/min | INTRAVENOUS | Status: DC
  Administered 2022-07-23: 2 ug/min via INTRAVENOUS
  Filled 2022-07-23: qty 250

## 2022-07-23 MED ORDER — SODIUM CHLORIDE 0.9 % IV SOLN: 250.0000 mL | INTRAVENOUS | Status: DC

## 2022-07-23 MED ORDER — SODIUM CHLORIDE 0.9 % IV BOLUS
500.0000 mL | Freq: Once | INTRAVENOUS | Status: AC
  Administered 2022-07-23: 500 mL via INTRAVENOUS

## 2022-07-23 MED ORDER — APIXABAN 5 MG PO TABS
5.0000 mg | ORAL_TABLET | Freq: Two times a day (BID) | ORAL | Status: DC
  Administered 2022-07-23 – 2022-07-24 (×3): 5 mg via ORAL
  Filled 2022-07-23 (×3): qty 1

## 2022-07-23 MED ORDER — LISINOPRIL 20 MG PO TABS
20.0000 mg | ORAL_TABLET | Freq: Every day | ORAL | Status: DC
  Administered 2022-07-24: 20 mg via ORAL
  Filled 2022-07-23: qty 1

## 2022-07-23 MED ORDER — AMLODIPINE BESYLATE 5 MG PO TABS
5.0000 mg | ORAL_TABLET | Freq: Every day | ORAL | Status: DC
  Administered 2022-07-23 – 2022-07-24 (×2): 5 mg via ORAL
  Filled 2022-07-23 (×2): qty 1

## 2022-07-23 NOTE — Progress Notes (Addendum)
ANTICOAGULATION CONSULT NOTE  Pharmacy Consult for Heparin Indication:  dural venous thrombosis  Allergies  Allergen Reactions   Zolpidem Tartrate     Leaves him groggy in the AM    Patient Measurements: Height:  (170.2 cm) Weight: 79.6 kg (175 lb 7.8 oz) IBW/kg (Calculated) : 66.1 Heparin Dosing Weight: 80 kg  Vital Signs: Temp: 98.7 F (37.1 C) (04/25 0400) Temp Source: Oral (04/25 0400) BP: 107/65 (04/25 0700) Pulse Rate: 65 (04/25 0700)  Labs: Recent Labs    07/21/22 0445 07/22/22 0332 07/22/22 1328 07/23/22 0450 07/23/22 0613  HGB 13.8 14.3 13.3 11.8*  --   HCT 38.6* 40.8 39.0 34.2*  --   PLT 220 238  --  213  --   LABPROT  --  14.2  --   --   --   INR  --  1.1  --   --   --   HEPARINUNFRC 0.38 0.26*  --   --  0.34  CREATININE  --  0.89  --  0.78  --      Estimated Creatinine Clearance: 103 mL/min (by C-G formula based on SCr of 0.78 mg/dL).   Assessment: 57 yo M presents to ED with acute memory loss and aphasia since 07/15/22. CT head c/f dural venous thrombosis. CT venogram shows filling defects in the L transverse sinus, L sigmoid sinus, and in the jugular bulb concerning for thrombosis. Pharmacy consulted to dose and manage heparin infusion.  S/p embolization of intracranial AMV on 4/24.  Heparin level is therapeutic at 0.34 units/mL, on 1000 units/hr. CBC stable; no bleeding reported.  Goal of Therapy:  Heparin level 0.3-0.5 units/ml Monitor platelets by anticoagulation protocol: Yes   Plan:  No bolus Continue heparin infusion at 1000 units/hr Daily heparin level and CBC Monitor for s/sx of bleeding F/u transition to PO anticoagulant  Irie Fiorello D. Laney Potash, PharmD, BCPS, BCCCP 07/23/2022, 7:32 AM   ============================  Addendum: Switch patient to Eliquis, no loading per Neurologist Patient and wife aware of copay and deductible requirement; they are working on a copay card and are agreeable to switching to Eliquis. Eliquis  PO BID  - stop IV heparin when Eliquis is administered Pharmacy will follow peripherally.  Thank you for the consult!  Ifeoluwa Beller D. Laney Potash, PharmD, BCPS, BCCCP 07/23/2022, 10:18 AM

## 2022-07-23 NOTE — Progress Notes (Signed)
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 Endovascular embolization performed with Onyx liquid embolic agent via left MMA and left Occipital artery branches. No residual AV fistula noted at the end of the procedure. The patient tolerated the procedure well without incident or complication and is in stable condition.   Pt is up in bed without complaints Denies HA; denies N/V Denies vision or speech changes No dizziness; numbness; tingling   Allergies: Zolpidem tartrate  Medications: Prior to Admission medications   Medication Sig Start Date End Date Taking? Authorizing Provider  lisinopril (PRINIVIL,ZESTRIL) 40 MG tablet Take 20 mg by mouth daily. Pt stated he takes 1/2 tablet everyday since a year ago.   Yes [provider]  loratadine (CLARITIN) 10 MG tablet Take 10 mg by mouth daily as needed for allergies.   Yes [provider]  rosuvastatin (CRESTOR) 5 MG tablet Take 5 mg by mouth daily.   Yes [provider]  traZODone (DESYREL) 100 MG tablet Take 50 mg by mouth at bedtime. Pt stated he took 1/2 tablet the night of 07/15/22   Yes [provider]  amLODipine (NORVASC) 10 MG tablet Take 10 mg by mouth daily. Patient not taking: Reported on 07/16/2022    [provider]  aspirin 81 MG tablet Take 81 mg by mouth daily. Patient not taking: Reported on 07/16/2022    [provider]  valACYclovir (VALTREX) 1000 MG tablet Take 1,000 mg by mouth 2 (two) times daily. Patient not taking: Reported on 07/16/2022    [provider]     Vital Signs: BP 110/69 (BP Location: Right Arm)   Pulse 100   Temp 98.2 F (36.8 C) (Oral)   Resp 17   Ht  (1.702 m)   Wt 175 lb 7.8 oz (79.6 kg)   SpO2 97%   BMI 27.49 kg/m    Physical Exam Vitals reviewed.  Constitutional:      Comments: Face symmetrical; smile =  Skin:    General: Skin is warm.     Comments: Rt groin site is clean and dry NT No bleeding No hematoma Rt foot: 2+ pulses  Neurological:     Mental Status: He is alert.     Imaging: IR US Guide Vasc Access Right  Result Date: 07/20/2022 INDICATION: 57 year old male presenting with fluctuating levels of altered mental status since 07/15/2022. 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 He comes to our service today to evaluate cross-sectional imaging findings. EXAM: ULTRASOUND-GUIDED VASCULAR ACCESS DIAGNOSTIC CEREBRAL ANGIOGRAM COMPARISON:  CTA head and neck July 17, 2022; MRA July 16, 2022. MEDICATIONS: No antibiotic was administered. ANESTHESIA/SEDATION: Moderate (conscious) sedation was employed during this procedure. A total of Versed 1.5 mg and Fentanyl 20 mcg was administered intravenously by the radiology nurse. Total intra-service moderate Sedation Time: 54 minutes. The patient's level of consciousness and vital signs were monitored continuously by radiology nursing throughout the procedure under my direct supervision. CONTRAST:  108 mL of Omnipaque 300 milligram/mL FLUOROSCOPY: Radiation Exposure Index (as provided by the fluoroscopic device): Peak skin dose 1,277 mGy Kerma COMPLICATIONS: None immediate. TECHNIQUE: Informed written consent was obtained from the patient after a thorough discussion of the procedural risks, benefits and alternatives. All questions  were addressed. Maximal Sterile Barrier Technique was utilized including caps, mask, sterile gowns, sterile gloves, sterile drape, hand hygiene and skin antiseptic. A timeout was performed prior to the initiation of the procedure. Using the modified Seldinger technique and a micropuncture kit, access was gained to the distal right radial artery at  the anatomical snuffbox and a 5 French sheath was placed. Real-time ultrasound guidance was utilized for vascular access including the acquisition of a permanent ultrasound image documenting patency of the accessed vessel. Slow intra arterial infusion of 5,000 IU heparin, 5 mg Verapamil and 200 mcg nitroglycerin diluted in patient's own blood was performed. No significant fluctuation in patient's blood pressure seen. Then, a right radial artery angiogram was obtained via sheath side port. Normal brachial artery branching pattern seen. No significant anatomical variation. The right radial artery caliber is adequate for vascular access. Next, a 5 [place] Simmons 2 glide catheter was navigated over a 0.035" Terumo Glidewire into the right subclavian artery under fluoroscopic guidance and subsequently into the right common carotid artery. Frontal and lateral angiograms of the neck were obtained. Using biplane roadmap guidance, the catheter was placed into the right internal carotid artery. Frontal and lateral angiograms of the head were obtained. The catheter was retracted into the right common carotid artery. Using biplane roadmap guidance, the catheter was placed into the right external carotid artery. Frontal and lateral angiograms of the head were obtained. Next, the catheter was placed into the left subclavian artery. Using roadmap guidance, the catheter was advanced into the left vertebral artery. Frontal and lateral angiograms of the head were obtained. Subsequently, the catheter was placed into the left common carotid artery. Frontal and lateral angiograms of the neck were obtained. Using biplane roadmap guidance, the catheter was placed into the left internal carotid artery. Frontal and lateral angiograms of the head were obtained. The catheter was then retracted into the left common carotid artery. Using biplane roadmap guidance, the catheter was advanced into the left external carotid artery. Frontal and  lateral angiograms of the head were obtained. Then, the catheter was placed into the left occipital artery. Angiograms with frontal, lateral, magnified left anterior oblique and magnified shoulders view of the head were obtained. The catheter was subsequently withdrawn. An inflatable band was placed and inflated over the right hand access site. The vascular sheath was withdrawn and the band was slowly deflated until brisk flow was noted through the arteriotomy site. At this point, the band was reinflated with additional 2 cc of air to obtain patent hemostasis. FINDINGS: Right radial artery ultrasound and right radial artery angiogram: The caliber of the distal right radial artery is appropriate for angiogram access. The right radial artery and the right ulnar artery have normal course and caliber. No significant anatomical variants noted. Right CCA angiograms: Cervical angiograms show normal course and caliber of the visualized right common carotid and internal carotid arteries. There are no significant stenoses. Right ICA angiograms: There is brisk vascular contrast filling of the right ACA and MCA vascular trees. Luminal caliber is smooth and tapering. No aneurysms or abnormally high-flow, early draining veins are seen. No regions of abnormal hypervascularity are noted. No opacification of the left transverse and sigmoid sinuses. Other dural sinuses are patent. Right ECA angiograms: No early venous drainage was noted. The visualized branches of the right external carotid artery are unremarkable. Left vertebral artery angiograms: The left vertebral artery, basilar artery, and bilateral posterior cerebral arteries are unremarkable. Luminal caliber is smooth and tapering. No aneurysms  or abnormally high-flow, early draining veins are seen. No regions of abnormal hypervascularity are noted. No opacification of the left transverse and sigmoid sinuses. Other dural sinuses are patent. Dilated left occipital and  cerebellar veins are noted, suggesting venous hypertension. Left CCA angiograms: Cervical angiograms show normal course and caliber of the visualized left common carotid and internal carotid arteries. There are no significant stenoses. Left ICA angiograms: There is brisk vascular contrast filling of the left ACA and MCA vascular trees. Luminal caliber is smooth and tapering. No aneurysms or abnormally high-flow, early draining veins are seen. No regions of abnormal hypervascularity are noted. No opacification of the left transverse and sigmoid sinuses. Other dural sinuses are patent. Evidence of dilated veins along the left cerebral hemisphere, suggesting venous hypertension. Left ECA and selective left occipital artery angiograms: Early venous drainage related to dural AV fistula at the left transverse-sigmoid sinus junction. The facial a supplied by the petrosal and petrosquamosal branch from the left middle meningeal artery, and branches of the left occipital artery. The facial drains into a small patent segment of the sinus at the left transverse-sigmoid junction and into the vein of Labbe. There is cortical venous reflux and ectasias. 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 PROCEDURE: No intervention performed. 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 PLAN: Findings discussed with the patient and Dr. Pearlean Brownie. Fistula embolization planned for July 21, 2020. Electronically Signed   By: Baldemar Lenis M.D.   On: 07/20/2022 14:37   IR ANGIO INTRA EXTRACRAN SEL INTERNAL CAROTID BILAT MOD SED  Result Date: 07/20/2022 INDICATION: 57 year old male presenting with fluctuating levels of altered mental status since 07/15/2022. Head CT and MRI showed thrombosis of the left transverse/sigmoid  sinus extending to the jugular vein with associated dural AV fistula. He comes to our service today to evaluate cross-sectional imaging findings. EXAM: ULTRASOUND-GUIDED VASCULAR ACCESS DIAGNOSTIC CEREBRAL ANGIOGRAM COMPARISON:  CTA head and neck July 17, 2022; MRA July 16, 2022. MEDICATIONS: No antibiotic was administered. ANESTHESIA/SEDATION: Moderate (conscious) sedation was employed during this procedure. A total of Versed 1.5 mg and Fentanyl 20 mcg was administered intravenously by the radiology nurse. Total intra-service moderate Sedation Time: 54 minutes. The patient's level of consciousness and vital signs were monitored continuously by radiology nursing throughout the procedure under my direct supervision. CONTRAST:  108 mL of Omnipaque 300 milligram/mL FLUOROSCOPY: Radiation Exposure Index (as provided by the fluoroscopic device): Peak skin dose 1,277 mGy Kerma COMPLICATIONS: None immediate. TECHNIQUE: Informed written consent was obtained from the patient after a thorough discussion of the procedural risks, benefits and alternatives. All questions were addressed. Maximal Sterile Barrier Technique was utilized including caps, mask, sterile gowns, sterile gloves, sterile drape, hand hygiene and skin antiseptic. A timeout was performed prior to the initiation of the procedure. Using the modified Seldinger technique and a micropuncture kit, access was gained to the distal right radial artery at the anatomical snuffbox and a 5 French sheath was placed. Real-time ultrasound guidance was utilized for vascular access including the acquisition of a permanent ultrasound image documenting patency of the accessed vessel. Slow intra arterial infusion of 5,000 IU heparin, 5 mg Verapamil and 200 mcg nitroglycerin diluted in patient's own blood was performed. No significant fluctuation in patient's blood pressure seen. Then, a right radial artery angiogram  was obtained via sheath side port. Normal brachial artery  branching pattern seen. No significant anatomical variation. The right radial artery caliber is adequate for vascular access. Next, a 5 [place] Simmons 2 glide catheter was navigated over a 0.035" Terumo Glidewire into the right subclavian artery under fluoroscopic guidance and subsequently into the right common carotid artery. Frontal and lateral angiograms of the neck were obtained. Using biplane roadmap guidance, the catheter was placed into the right internal carotid artery. Frontal and lateral angiograms of the head were obtained. The catheter was retracted into the right common carotid artery. Using biplane roadmap guidance, the catheter was placed into the right external carotid artery. Frontal and lateral angiograms of the head were obtained. Next, the catheter was placed into the left subclavian artery. Using roadmap guidance, the catheter was advanced into the left vertebral artery. Frontal and lateral angiograms of the head were obtained. Subsequently, the catheter was placed into the left common carotid artery. Frontal and lateral angiograms of the neck were obtained. Using biplane roadmap guidance, the catheter was placed into the left internal carotid artery. Frontal and lateral angiograms of the head were obtained. The catheter was then retracted into the left common carotid artery. Using biplane roadmap guidance, the catheter was advanced into the left external carotid artery. Frontal and lateral angiograms of the head were obtained. Then, the catheter was placed into the left occipital artery. Angiograms with frontal, lateral, magnified left anterior oblique and magnified shoulders view of the head were obtained. The catheter was subsequently withdrawn. An inflatable band was placed and inflated over the right hand access site. The vascular sheath was withdrawn and the band was slowly deflated until brisk flow was noted through the arteriotomy site. At this point, the band was reinflated with  additional 2 cc of air to obtain patent hemostasis. FINDINGS: Right radial artery ultrasound and right radial artery angiogram: The caliber of the distal right radial artery is appropriate for angiogram access. The right radial artery and the right ulnar artery have normal course and caliber. No significant anatomical variants noted. Right CCA angiograms: Cervical angiograms show normal course and caliber of the visualized right common carotid and internal carotid arteries. There are no significant stenoses. Right ICA angiograms: There is brisk vascular contrast filling of the right ACA and MCA vascular trees. Luminal caliber is smooth and tapering. No aneurysms or abnormally high-flow, early draining veins are seen. No regions of abnormal hypervascularity are noted. No opacification of the left transverse and sigmoid sinuses. Other dural sinuses are patent. Right ECA angiograms: No early venous drainage was noted. The visualized branches of the right external carotid artery are unremarkable. Left vertebral artery angiograms: The left vertebral artery, basilar artery, and bilateral posterior cerebral arteries are unremarkable. Luminal caliber is smooth and tapering. No aneurysms or abnormally high-flow, early draining veins are seen. No regions of abnormal hypervascularity are noted. No opacification of the left transverse and sigmoid sinuses. Other dural sinuses are patent. Dilated left occipital and cerebellar veins are noted, suggesting venous hypertension. Left CCA angiograms: Cervical angiograms show normal course and caliber of the visualized left common carotid and internal carotid arteries. There are no significant stenoses. Left ICA angiograms: There is brisk vascular contrast filling of the left ACA and MCA vascular trees. Luminal caliber is smooth and tapering. No aneurysms or abnormally high-flow, early draining veins are seen. No regions of abnormal hypervascularity are noted. No opacification of the  left transverse and sigmoid sinuses. Other dural sinuses are patent.  Evidence of dilated veins along the left cerebral hemisphere, suggesting venous hypertension. Left ECA and selective left occipital artery angiograms: Early venous drainage related to dural AV fistula at the left transverse-sigmoid sinus junction. The facial a supplied by the petrosal and petrosquamosal branch from the left middle meningeal artery, and branches of the left occipital artery. The facial drains into a small patent segment of the sinus at the left transverse-sigmoid junction and into the vein of Labbe. There is cortical venous reflux and ectasias. 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 PROCEDURE: No intervention performed. 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 PLAN: Findings discussed with the patient and Dr. Pearlean Brownie. Fistula embolization planned for July 21, 2020. Electronically Signed   By: Baldemar Lenis M.D.   On: 07/20/2022 14:37   IR ANGIO EXTERNAL CAROTID SEL EXT CAROTID BILAT MOD SED  Result Date: 07/20/2022 INDICATION: 57 year old male presenting with fluctuating levels of altered mental status since 07/15/2022. 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 He comes to our service today to evaluate cross-sectional imaging findings. EXAM: ULTRASOUND-GUIDED VASCULAR ACCESS DIAGNOSTIC CEREBRAL ANGIOGRAM COMPARISON:  CTA head and neck July 17, 2022; MRA July 16, 2022. MEDICATIONS: No antibiotic was administered. ANESTHESIA/SEDATION: Moderate (conscious) sedation was employed during this procedure. A total of Versed 1.5 mg and Fentanyl 20 mcg was administered intravenously by the radiology nurse. Total  intra-service moderate Sedation Time: 54 minutes. The patient's level of consciousness and vital signs were monitored continuously by radiology nursing throughout the procedure under my direct supervision. CONTRAST:  108 mL of Omnipaque 300 milligram/mL FLUOROSCOPY: Radiation Exposure Index (as provided by the fluoroscopic device): Peak skin dose 1,277 mGy Kerma COMPLICATIONS: None immediate. TECHNIQUE: Informed written consent was obtained from the patient after a thorough discussion of the procedural risks, benefits and alternatives. All questions were addressed. Maximal Sterile Barrier Technique was utilized including caps, mask, sterile gowns, sterile gloves, sterile drape, hand hygiene and skin antiseptic. A timeout was performed prior to the initiation of the procedure. Using the modified Seldinger technique and a micropuncture kit, access was gained to the distal right radial artery at the anatomical snuffbox and a 5 French sheath was placed. Real-time ultrasound guidance was utilized for vascular access including the acquisition of a permanent ultrasound image documenting patency of the accessed vessel. Slow intra arterial infusion of 5,000 IU heparin, 5 mg Verapamil and 200 mcg nitroglycerin diluted in patient's own blood was performed. No significant fluctuation in patient's blood pressure seen. Then, a right radial artery angiogram was obtained via sheath side port. Normal brachial artery branching pattern seen. No significant anatomical variation. The right radial artery caliber is adequate for vascular access. Next, a 5 [place] Simmons 2 glide catheter was navigated over a 0.035" Terumo Glidewire into the right subclavian artery under fluoroscopic guidance and subsequently into the right common carotid artery. Frontal and lateral angiograms of the neck were obtained. Using biplane roadmap guidance, the catheter was placed into the right internal carotid artery. Frontal and lateral angiograms of the head  were obtained. The catheter was retracted into the right common carotid artery. Using biplane roadmap guidance, the catheter was placed into the right external carotid artery. Frontal and lateral angiograms of the  head were obtained. Next, the catheter was placed into the left subclavian artery. Using roadmap guidance, the catheter was advanced into the left vertebral artery. Frontal and lateral angiograms of the head were obtained. Subsequently, the catheter was placed into the left common carotid artery. Frontal and lateral angiograms of the neck were obtained. Using biplane roadmap guidance, the catheter was placed into the left internal carotid artery. Frontal and lateral angiograms of the head were obtained. The catheter was then retracted into the left common carotid artery. Using biplane roadmap guidance, the catheter was advanced into the left external carotid artery. Frontal and lateral angiograms of the head were obtained. Then, the catheter was placed into the left occipital artery. Angiograms with frontal, lateral, magnified left anterior oblique and magnified shoulders view of the head were obtained. The catheter was subsequently withdrawn. An inflatable band was placed and inflated over the right hand access site. The vascular sheath was withdrawn and the band was slowly deflated until brisk flow was noted through the arteriotomy site. At this point, the band was reinflated with additional 2 cc of air to obtain patent hemostasis. FINDINGS: Right radial artery ultrasound and right radial artery angiogram: The caliber of the distal right radial artery is appropriate for angiogram access. The right radial artery and the right ulnar artery have normal course and caliber. No significant anatomical variants noted. Right CCA angiograms: Cervical angiograms show normal course and caliber of the visualized right common carotid and internal carotid arteries. There are no significant stenoses. Right ICA  angiograms: There is brisk vascular contrast filling of the right ACA and MCA vascular trees. Luminal caliber is smooth and tapering. No aneurysms or abnormally high-flow, early draining veins are seen. No regions of abnormal hypervascularity are noted. No opacification of the left transverse and sigmoid sinuses. Other dural sinuses are patent. Right ECA angiograms: No early venous drainage was noted. The visualized branches of the right external carotid artery are unremarkable. Left vertebral artery angiograms: The left vertebral artery, basilar artery, and bilateral posterior cerebral arteries are unremarkable. Luminal caliber is smooth and tapering. No aneurysms or abnormally high-flow, early draining veins are seen. No regions of abnormal hypervascularity are noted. No opacification of the left transverse and sigmoid sinuses. Other dural sinuses are patent. Dilated left occipital and cerebellar veins are noted, suggesting venous hypertension. Left CCA angiograms: Cervical angiograms show normal course and caliber of the visualized left common carotid and internal carotid arteries. There are no significant stenoses. Left ICA angiograms: There is brisk vascular contrast filling of the left ACA and MCA vascular trees. Luminal caliber is smooth and tapering. No aneurysms or abnormally high-flow, early draining veins are seen. No regions of abnormal hypervascularity are noted. No opacification of the left transverse and sigmoid sinuses. Other dural sinuses are patent. Evidence of dilated veins along the left cerebral hemisphere, suggesting venous hypertension. Left ECA and selective left occipital artery angiograms: Early venous drainage related to dural AV fistula at the left transverse-sigmoid sinus junction. The facial a supplied by the petrosal and petrosquamosal branch from the left middle meningeal artery, and branches of the left occipital artery. The facial drains into a small patent segment of the sinus at  the left transverse-sigmoid junction and into the vein of Labbe. There is cortical venous reflux and ectasias. 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 PROCEDURE: No intervention performed. 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 PLAN: Findings discussed with the patient and Dr. Pearlean Brownie. Fistula embolization planned for July 21, 2020. Electronically Signed   By: Baldemar Lenis M.D.   On: 07/20/2022 14:37   IR ANGIO VERTEBRAL SEL VERTEBRAL UNI L MOD SED  Result Date: 07/20/2022 INDICATION: 57 year old male presenting with fluctuating levels of altered mental status since 07/15/2022. 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 He comes to our service today to evaluate cross-sectional imaging findings. EXAM: ULTRASOUND-GUIDED VASCULAR ACCESS DIAGNOSTIC CEREBRAL ANGIOGRAM COMPARISON:  CTA head and neck July 17, 2022; MRA July 16, 2022. MEDICATIONS: No antibiotic was administered. ANESTHESIA/SEDATION: Moderate (conscious) sedation was employed during this procedure. A total of Versed 1.5 mg and Fentanyl 20 mcg was administered intravenously by the radiology nurse. Total intra-service moderate Sedation Time: 54 minutes. The patient's level of consciousness and vital signs were monitored continuously by radiology nursing throughout the procedure under my direct supervision. CONTRAST:  108 mL of Omnipaque 300 milligram/mL FLUOROSCOPY: Radiation Exposure Index (as provided by the fluoroscopic device): Peak skin dose 1,277 mGy Kerma COMPLICATIONS: None immediate. TECHNIQUE: Informed written consent was obtained from the patient after a thorough discussion of the procedural risks, benefits and alternatives.  All questions were addressed. Maximal Sterile Barrier Technique was utilized including caps, mask, sterile gowns, sterile gloves, sterile drape, hand hygiene and skin antiseptic. A timeout was performed prior to the initiation of the procedure. Using the modified Seldinger technique and a micropuncture kit, access was gained to the distal right radial artery at the anatomical snuffbox and a 5 French sheath was placed. Real-time ultrasound guidance was utilized for vascular access including the acquisition of a permanent ultrasound image documenting patency of the accessed vessel. Slow intra arterial infusion of 5,000 IU heparin, 5 mg Verapamil and 200 mcg nitroglycerin diluted in patient's own blood was performed. No significant fluctuation in patient's blood pressure seen. Then, a right radial artery angiogram was obtained via sheath side port. Normal brachial artery branching pattern seen. No significant anatomical variation. The right radial artery caliber is adequate for vascular access. Next, a 5 [place] Simmons 2 glide catheter was navigated over a 0.035" Terumo Glidewire into the right subclavian artery under fluoroscopic guidance and subsequently into the right common carotid artery. Frontal and lateral angiograms of the neck were obtained. Using biplane roadmap guidance, the catheter was placed into the right internal carotid artery. Frontal and lateral angiograms of the head were obtained. The catheter was retracted into the right common carotid artery. Using biplane roadmap guidance, the catheter was placed into the right external carotid artery. Frontal and lateral angiograms of the head were obtained. Next, the catheter was placed into the left subclavian artery. Using roadmap guidance, the catheter was advanced into the left vertebral artery. Frontal and lateral angiograms of the head were obtained. Subsequently, the catheter was placed into the left common carotid artery. Frontal and lateral angiograms  of the neck were obtained. Using biplane roadmap guidance, the catheter was placed into the left internal carotid artery. Frontal and lateral angiograms of the head were obtained. The catheter was then retracted into the left common carotid artery. Using biplane roadmap guidance, the catheter was advanced into the left external carotid artery. Frontal and lateral angiograms of the head were obtained. Then, the catheter was placed into the left occipital artery.  Angiograms with frontal, lateral, magnified left anterior oblique and magnified shoulders view of the head were obtained. The catheter was subsequently withdrawn. An inflatable band was placed and inflated over the right hand access site. The vascular sheath was withdrawn and the band was slowly deflated until brisk flow was noted through the arteriotomy site. At this point, the band was reinflated with additional 2 cc of air to obtain patent hemostasis. FINDINGS: Right radial artery ultrasound and right radial artery angiogram: The caliber of the distal right radial artery is appropriate for angiogram access. The right radial artery and the right ulnar artery have normal course and caliber. No significant anatomical variants noted. Right CCA angiograms: Cervical angiograms show normal course and caliber of the visualized right common carotid and internal carotid arteries. There are no significant stenoses. Right ICA angiograms: There is brisk vascular contrast filling of the right ACA and MCA vascular trees. Luminal caliber is smooth and tapering. No aneurysms or abnormally high-flow, early draining veins are seen. No regions of abnormal hypervascularity are noted. No opacification of the left transverse and sigmoid sinuses. Other dural sinuses are patent. Right ECA angiograms: No early venous drainage was noted. The visualized branches of the right external carotid artery are unremarkable. Left vertebral artery angiograms: The left vertebral artery, basilar  artery, and bilateral posterior cerebral arteries are unremarkable. Luminal caliber is smooth and tapering. No aneurysms or abnormally high-flow, early draining veins are seen. No regions of abnormal hypervascularity are noted. No opacification of the left transverse and sigmoid sinuses. Other dural sinuses are patent. Dilated left occipital and cerebellar veins are noted, suggesting venous hypertension. Left CCA angiograms: Cervical angiograms show normal course and caliber of the visualized left common carotid and internal carotid arteries. There are no significant stenoses. Left ICA angiograms: There is brisk vascular contrast filling of the left ACA and MCA vascular trees. Luminal caliber is smooth and tapering. No aneurysms or abnormally high-flow, early draining veins are seen. No regions of abnormal hypervascularity are noted. No opacification of the left transverse and sigmoid sinuses. Other dural sinuses are patent. Evidence of dilated veins along the left cerebral hemisphere, suggesting venous hypertension. Left ECA and selective left occipital artery angiograms: Early venous drainage related to dural AV fistula at the left transverse-sigmoid sinus junction. The facial a supplied by the petrosal and petrosquamosal branch from the left middle meningeal artery, and branches of the left occipital artery. The facial drains into a small patent segment of the sinus at the left transverse-sigmoid junction and into the vein of Labbe. There is cortical venous reflux and ectasias. 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 PROCEDURE: No intervention performed. 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 PLAN: Findings discussed with the patient and Dr. Pearlean Brownie. Fistula embolization planned for July 21, 2020.  Electronically Signed   By: Baldemar Lenis M.D.   On: 07/20/2022 14:37    Labs:  CBC: Recent Labs    07/20/22 0453 07/21/22 0445 07/22/22 0332 07/22/22 1328 07/23/22 0450  WBC 7.2 6.2 6.6  --  7.7  HGB 13.6 13.8 14.3 13.3 11.8*  HCT 39.7 38.6* 40.8 39.0 34.2*  PLT 224 220 238  --  213    COAGS: Recent Labs    07/16/22 1017 07/20/22 0453 07/22/22 0332  INR 1.0  1.1 1.1  APTT 27  --   --     BMP: Recent Labs    07/16/22 1017 07/16/22 1034 07/22/22 0332 07/22/22 1328 07/23/22 0450  NA 137 138 138 139 139  K 4.0 4.1 3.3* 3.4* 3.9  CL 101 101 104  --  110  CO2 26  --  24  --  20*  GLUCOSE 108* 105* 91  --  147*  BUN 15 18 10   --  8  CALCIUM 9.6  --  8.8*  --  8.3*  CREATININE 1.07 1.00 0.89  --  0.78  GFRNONAA >60  --  >60  --  >60    LIVER FUNCTION TESTS: Recent Labs    07/16/22 1017 07/23/22 0450  BILITOT 1.0  --   AST 26  --   ALT 37  --   ALKPHOS 48  --   PROT 7.1  --   ALBUMIN 4.3 3.1*    Assessment and Plan:  Left transverse/sigmoid sinus dural AV fistula. Endovascular embolization performed with Onyx liquid embolic agent via left MMA and left Occipital artery branches. No residual AV fistula noted at the end of the procedure. The patient tolerated the procedure well without incident or complication and is in stable condition.   Will follow with Neuro Plan for 6 month MRV as OP; then to determine timing of Angiogram  -orders in place  Electronically Signed: Robet Leu, PA-C 07/23/2022, 9:22 AM   I spent a total of 15 Minutes at the the patient's bedside AND on the patient's hospital floor or unit, greater than 50% of which was counseling/coordinating care for AV fistula embolization

## 2022-07-23 NOTE — Evaluation (Signed)
Physical Therapy Evaluation Patient Details Name: Donald Galloway MRN: [id number] DOB: 11/09/1965 Today's Date: 07/23/2022  History of Present Illness  Pt is a 57 y/o male presenting on 4/18 with 2 days of transient slurred speech and confusion. Imaging with acute dural venous sinus thrombosis, MRI with small volume acute SAH of L sylvian fissure. Underwent IR endovascular emobolization of L transverse/sigmoid sinus dural AV fistula on 07/22/22.  PMH includes: herpes simplex, HTN, insomnia.  Clinical Impression  Patient presents with mobility close to functional baseline.  Able to complete DGI and scored 23/24 and having no issues with mobility per pt.  Feel no follow up PT needed at this time.  Encouraged mobility.  PT will sign off.      Recommendations for follow up therapy are one component of a multi-disciplinary discharge planning process, led by the attending physician.  Recommendations may be updated based on patient status, additional functional criteria and insurance authorization.  Follow Up Recommendations       Assistance Recommended at Discharge PRN  Patient can return home with the following  Assist for transportation    Equipment Recommendations None recommended by PT  Recommendations for Other Services       Functional Status Assessment Patient has not had a recent decline in their functional status     Precautions / Restrictions Precautions Precautions: None      Mobility  Bed Mobility Overal bed mobility: Modified Independent                  Transfers Overall transfer level: Modified independent                      Ambulation/Gait Ambulation/Gait assistance: Independent Gait Distance (Feet): 200 Feet Assistive device: None Gait Pattern/deviations: WFL(Within Functional Limits)          Stairs Stairs: Yes Stairs assistance: Supervision Stair Management: One rail Right, Alternating pattern, Forwards Number of Stairs: 2     Wheelchair Mobility    Modified Rankin (Stroke Patients Only) Modified Rankin (Stroke Patients Only) Pre-Morbid Rankin Score: No symptoms Modified Rankin: Slight disability     Balance Overall balance assessment: Independent                               Standardized Balance Assessment Standardized Balance Assessment : Dynamic Gait Index   Dynamic Gait Index Level Surface: Normal Change in Gait Speed: Normal Gait with Horizontal Head Turns: Normal Gait with Vertical Head Turns: Normal Gait and Pivot Turn: Normal Step Over Obstacle: Normal Step Around Obstacles: Normal Steps: Mild Impairment Total Score: 23       Pertinent Vitals/Pain Pain Assessment Pain Assessment: Faces Faces Pain Scale: Hurts little more Pain Location: R groin from IR procedure Pain Descriptors / Indicators: Discomfort Pain Intervention(s): Monitored during session    Home Living Family/patient expects to be discharged to:: Private residence Living Arrangements: Spouse/significant other;Children Available Help at Discharge: Family Type of Home: House Home Access: Stairs to enter Entrance Stairs-Rails: Right Entrance Stairs-Number of Steps: 3   Home Layout: One level Home Equipment: None      Prior Function Prior Level of Function : Independent/Modified Independent             Mobility Comments: works at [employer] for a [employer]        Extremity/Trunk Assessment   Upper Extremity Assessment Upper Extremity Assessment: Overall  WFL for tasks assessed    Lower Extremity Assessment Lower Extremity Assessment: Overall WFL for tasks assessed       Communication   Communication: No difficulties  Cognition Arousal/Alertness: Awake/alert Behavior During Therapy: WFL for tasks assessed/performed Overall Cognitive Status: Within Functional Limits for tasks assessed                                          General Comments       Exercises     Assessment/Plan    PT Assessment Patient does not need any further PT services  PT Problem List         PT Treatment Interventions      PT Goals (Current goals can be found in the Care Plan section)  Acute Rehab PT Goals PT Goal Formulation: All assessment and education complete, DC therapy    Frequency       Co-evaluation               AM-PAC PT "6 Clicks" Mobility  Outcome Measure Help needed turning from your back to your side while in a flat bed without using bedrails?: None Help needed moving from lying on your back to sitting on the side of a flat bed without using bedrails?: None Help needed moving to and from a bed to a chair (including a wheelchair)?: None Help needed standing up from a chair using your arms (e.g., wheelchair or bedside chair)?: None Help needed to walk in hospital room?: None Help needed climbing 3-5 steps with a railing? : None 6 Click Score: 24    End of Session Equipment Utilized During Treatment: Gait belt Activity Tolerance: Patient tolerated treatment well Patient left: in bed;with call bell/phone within reach;with family/visitor present   PT Visit Diagnosis: Difficulty in walking, not elsewhere classified (R26.2)    Time: 1610-9604 PT Time Calculation (min) (ACUTE ONLY): 18 min   Charges:   PT Evaluation $PT Eval Low Complexity: 1 Low          Sheran Lawless, PT Acute Rehabilitation Services Office:[phone] 07/23/2022   Elray Mcgregor 07/23/2022, 5:42 PM

## 2022-07-23 NOTE — Progress Notes (Addendum)
STROKE TEAM PROGRESS NOTE   INTERVAL HISTORY Patient had successful embolization of AVM.yesterday with excellent results.  Patient lying comfortably in bed.  No complaints.  Neurological exam stable and nonfocal.  Patient is on low-dose pressors to maintain blood pressure.. Vital signs stable.  IV heparin drip has been restarted.  Vitals:   07/23/22 0900 07/23/22 0920 07/23/22 1020 07/23/22 1150  BP: 111/72  115/81 (!) 153/74  Pulse: 97 96  93  Resp: 20 20 (!) 21 18  Temp:    97.8 F (36.6 C)  TempSrc:    Oral  SpO2: 99% 97%  99%  Weight:      Height:       CBC:  Recent Labs  Lab 07/22/22 0332 07/22/22 1328 07/23/22 0450  WBC 6.6  --  7.7  NEUTROABS 2.7  --  6.0  HGB 14.3 13.3 11.8*  HCT 40.8 39.0 34.2*  MCV 86.3  --  86.4  PLT 238  --  213   Basic Metabolic Panel:  Recent Labs  Lab 07/22/22 0332 07/22/22 1328 07/23/22 0450  NA 138 139 139  K 3.3* 3.4* 3.9  CL 104  --  110  CO2 24  --  20*  GLUCOSE 91  --  147*  BUN 10  --  8  CREATININE 0.89  --  0.78  CALCIUM 8.8*  --  8.3*  MG 2.1  --  2.0  PHOS  --   --  3.0   Lipid Panel:  Recent Labs  Lab 07/17/22 0412  CHOL 174  TRIG 98  HDL 55  CHOLHDL 3.2  VLDL 20  LDLCALC 99   HgbA1c:  Recent Labs  Lab 07/16/22 2020  HGBA1C 5.2   Urine Drug Screen:  Recent Labs  Lab 07/16/22 2319  LABOPIA NONE DETECTED  COCAINSCRNUR NONE DETECTED  LABBENZ NONE DETECTED  AMPHETMU NONE DETECTED  THCU NONE DETECTED  LABBARB NONE DETECTED    Alcohol Level  No results for input(s): "ETH" in the last 168 hours.  IMAGING past 24 hours No results found.  PHYSICAL EXAM**  Temp:  [97.7 F (36.5 C)-98.7 F (37.1 C)] 97.8 F (36.6 C) (04/25 1150) Pulse Rate:  [59-100] 93 (04/25 1150) Resp:  [11-23] 18 (04/25 1150) BP: (93-153)/(58-81) 153/74 (04/25 1150) SpO2:  [94 %-100 %] 99 % (04/25 1150) Arterial Line BP: (98-147)/(47-68) 129/60 (04/25 0920)  General -  Pleasant middle-age male not in distress.  Afebrile.   Cardiac exam no murmur or gallop.  Lungs clear to auscultation.  Distal pulses well felt.  Groin is soft nontender Neurological Exam ;  Awake  Alert oriented x 3. Normal speech and language.eye movements full without nystagmus.fundi were not visualized. Vision acuity and fields appear normal. Hearing is normal. Palatal movements are normal. Face symmetric. Tongue midline. Normal strength, tone, reflexes and coordination. Normal sensation. Gait deferred.   ASSESSMENT/PLAN Donald Galloway is a 57 y.o. male with history of hypertension, hyperlipidemia, and sleep apnea who presented to the ED 07/16/22 for evaluation of fluctuating levels of altered mental status with onset on 07/15/2022.  The patient states that while he was at work yesterday, he felt that he was having trouble comprehending others as they were speaking to him and at times, he "zoned out" and did not hear their spoken words at all.  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 Linear hyperdensity in the left middle cranial fossa is nonspecific, but could represent a small amount of subarachnoid blood products. CTV Filling defect in the left transverse sinus, left sigmoid sinus, and left jugular bulb, concerning for thrombosis  CTA head & neck no LVO, AV fistula, small SAH, dural venous sinus thrombosis MRI  Abnormal signal in the left transverse and sigmoid sinuses and left jugular bulb, compatible with dural venous sinus thrombosis MRA AV fistula from left occipital branches to left transverse sigmoid junction IR cerebral angio : Cognard type IV dural AV fistula supplied by branches of the left middle meningeal artery and occipital artery and draining into a small  patent segment of the left transverse sigmoid sinus junction and vein of Labbe.  2D Echo EF 60 to 65% Venous doppler LE bilaterally for DVT EEG This normal EEG is recorded in the waking and sleep state. There was no seizure or seizure predisposition recorded on this study. Please note that lack of epileptiform activity on EEG does not preclude the possibility of epilepsy.  LDL 99 HgbA1c 5.2 UDS neg Hypercoagulable work up pending  but homocystine and anticardiolipin antibodies are normal VTE prophylaxis -IV heparin 81 mg prior to admission, now on IV heparin.  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 Sherlon Handing from IR DSA dural venous sinus thrombosis and dural AV fistula No angiograms scheduled for 4/24  Hypertension Home meds: lisinopril 20 mg, Stable On lisino 20 and norvasc 5 BP goal<160 Long-term BP goal normotensive  Hyperlipidemia Home meds: Crestor 5 mg LDL 99, goal < 70 On crestor 20 Continue statin at discharge  Other Stroke Risk Factors Obstructive sleep apnea, on CPAP at home  Hospital day # 7  Patient had successful embolization of the dural AV fistula yesterday by Dr. Sherlon Handing.  Patient is doing well.  Continue to wean off vasopressor support send mobilize out of bed as tolerated.  Change IV heparin to Eliquis for 6 to 9 months for his cerebral venous sinus thrombosis  Patient encouraged to maintain adequate hydration.  Likely discharge home in the next 1 to 2 days if stable.  Long discussion with patient and wife at the bedside and Dr. Sherlon Handing and Dr. Algis Downs. [facility] critical care medicine.This patient is critically ill and at significant risk of neurological worsening, death and care requires constant monitoring of vital signs,  hemodynamics,respiratory and cardiac monitoring, extensive review of multiple databases, frequent neurological assessment, discussion with family, other specialists and medical decision making of high complexity.I have made any additions or clarifications directly to the above note.This critical care time does not reflect procedure time, or teaching time or supervisory time of PA/NP/Med Resident etc but could involve care discussion time.  I spent 30 minutes of neurocritical care time  in the care of  this patient.     Donald Heady, MD Medical Director [facility] Pager: [phone] 07/23/2022 12:32 PM    To contact Stroke Continuity provider, please refer to [email or web address]. After hours, contact General Neurology

## 2022-07-23 NOTE — TOC Benefit Eligibility Note (Signed)
Patient Product/process development scientist completed.    The patient is currently admitted and upon discharge could be taking Eliquis 5 mg.  The current 30 day co-pay is $574.29.   The patient is insured through [employer]   This test claim was processed through [facility]- copay amounts may vary at other pharmacies due to pharmacy/plan contracts, or as the patient moves through the different stages of their insurance plan.  Roland Earl, CPHT Pharmacy Patient Advocate Specialist [facility] Patient Advocate Team Direct Number: [phone]  Fax: [phone]

## 2022-07-23 NOTE — Progress Notes (Signed)
eLink Physician-Brief Progress Note Patient Name: Donald Galloway DOB: 22-Feb-1966 MRN: [id number]   Date of Service  07/23/2022  HPI/Events of Note  BP 97/48, MAP 64, HR 73 after 500 ml iv fluid bolus.  eICU Interventions  BNP check, peripheral Norepinephrine gtt ordered to keep SBP > 100 mmHg.        Thomasene Lot Webber Michiels 07/23/2022, 4:21 AM

## 2022-07-23 NOTE — Discharge Summary (Addendum)
Stroke Discharge Summary  Patient ID: Donald Galloway   MRN: [id number]      DOB: [birth year]  Date of Admission: 07/16/2022 Date of Discharge: 07/24/2022  Attending Physician:  Dr. 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 Start taking on: July 25, 2022 What changed:  medication strength how much to take   apixaban 5 MG Tabs tablet Commonly known as: ELIQUIS Take 1 tablet (5 mg total) by mouth 2 (two) times daily.   lisinopril 20 MG tablet Commonly known as: ZESTRIL Take 1 tablet (20 mg total) by mouth daily. Start taking on: July 25, 2022 What changed:  medication strength additional instructions   loratadine 10 MG tablet Commonly known as: CLARITIN Take 10 mg by mouth daily as needed for allergies.   rosuvastatin 20 MG tablet Commonly known as: CRESTOR Take 1 tablet (20 mg total) by mouth daily. Start taking on: July 25, 2022 What changed:  medication strength how much to take   traZODone 100 MG tablet Commonly known as: DESYREL Take 50 mg by mouth at bedtime. Pt stated he took 1/2 tablet the night of 07/15/22        LABORATORY STUDIES CBC    Component Value Date/Time   WBC 8.1 07/24/2022 0701   RBC 4.21 (L) 07/24/2022 0701   HGB 12.9 (L) 07/24/2022 0701   HCT 36.3 (L)  07/24/2022 0701   PLT 224 07/24/2022 0701   MCV 86.2 07/24/2022 0701   MCH 30.6 07/24/2022 0701   MCHC 35.5 07/24/2022 0701   RDW 11.9 07/24/2022 0701   LYMPHSABS 1.4 07/23/2022 0450   MONOABS 0.6 07/23/2022 0450   EOSABS 0.0 07/23/2022 0450   BASOSABS 0.0 07/23/2022 0450   CMP    Component Value Date/Time   NA 139 07/23/2022 0450   K 3.9 07/23/2022 0450   CL 110 07/23/2022 0450   CO2 20 (L) 07/23/2022 0450   GLUCOSE 147 (H) 07/23/2022 0450   BUN 8 07/23/2022 0450   CREATININE 0.78 07/23/2022 0450   CALCIUM 8.3 (L) 07/23/2022 0450   PROT 7.1 07/16/2022 1017   ALBUMIN 3.1 (L) 07/23/2022 0450   AST 26 07/16/2022 1017   ALT 37 07/16/2022 1017   ALKPHOS 48 07/16/2022 1017   BILITOT 1.0 07/16/2022 1017   GFRNONAA >60 07/23/2022 0450   COAGS Lab Results  Component Value Date   INR 1.1 07/22/2022   INR 1.1 07/20/2022   INR 1.0 07/16/2022   Lipid Panel    Component Value Date/Time   CHOL 174 07/17/2022 0412   TRIG 98 07/17/2022 0412   HDL 55 07/17/2022 0412   CHOLHDL 3.2 07/17/2022 0412   VLDL 20  07/17/2022 0412   LDLCALC 99 07/17/2022 0412   HgbA1C  Lab Results  Component Value Date   HGBA1C 5.2 07/16/2022   Urinalysis No results found for: "COLORURINE", "APPEARANCEUR", "LABSPEC", "PHURINE", "GLUCOSEU", "HGBUR", "BILIRUBINUR", "KETONESUR", "PROTEINUR", "UROBILINOGEN", "NITRITE", "LEUKOCYTESUR" Urine Drug Screen     Component Value Date/Time   LABOPIA NONE DETECTED 07/16/2022 2319   COCAINSCRNUR NONE DETECTED 07/16/2022 2319   LABBENZ NONE DETECTED 07/16/2022 2319   AMPHETMU NONE DETECTED 07/16/2022 2319   THCU NONE DETECTED 07/16/2022 2319   LABBARB NONE DETECTED 07/16/2022 2319    Alcohol Level    Component Value Date/Time   ETH <10 07/16/2022 1017     SIGNIFICANT DIAGNOSTIC STUDIES IR Transcath/Emboliz  Result Date: 07/23/2022 INDICATION: 57 year old male presenting with fluctuating levels of altered mental status since 07/15/2022. Head CT and MRI  showed thrombosis of the left transverse/sigmoid sinus extending to the jugular vein with associated dural AV fistula. 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 EXAM: ULTRASOUND-GUIDED VASCULAR ACCESS DIAGNOSTIC CEREBRAL ANGIOGRAM ENDOVASCULAR AV FISTULA EMBOLIZATION FLAT PANEL HEAD CT COMPARISON:  Cerebral angiogram July 20, 2022. MEDICATIONS: Ancef 2 gm IV. The antibiotic was administered within 1 hour of the procedure ANESTHESIA/SEDATION: The procedure was performed under general anesthesia. CONTRAST:  190 mL of Omnipaque 300 milligram/mL FLUOROSCOPY: Radiation Exposure Index (as provided by the fluoroscopic device): 4,979 mGy Kerma COMPLICATIONS: None immediate. TECHNIQUE: Informed written consent was obtained from the patient after a thorough discussion of the procedural risks, benefits and alternatives. All questions were addressed. Maximal Sterile Barrier Technique was utilized including caps, mask, sterile gowns, sterile gloves, sterile drape, hand hygiene and skin antiseptic. A timeout was performed prior to the initiation of the procedure. The right groin was prepped and draped in the usual sterile fashion. Using a micropuncture kit and the modified Seldinger technique, access was gained to the right common femoral artery and an 8 French sheath was placed. Real-time ultrasound guidance was utilized for vascular access including the acquisition of a permanent ultrasound image documenting patency of the accessed vessel. Under fluoroscopy, an 8 [place] Cerebase guide catheter was navigated over a 6 [place] Berenstein 2 catheter and a 0.035" Terumo Glidewire into the aortic arch. The catheter was placed into the left common carotid  artery. Frontal and lateral angiograms of the neck were obtained. Using biplane roadmap guidance, the catheter was advanced into the left internal carotid artery. The diagnostic catheter was removed. Frontal and lateral angiograms of the head were obtained. The catheter was retracted into there left common carotid artery. Under biplane roadmap guidance, the catheter was advanced into the left external carotid artery. Frontal and lateral angiograms of the head were obtained. The catheter was then selectively placed into the left occipital artery. Frontal, lateral and multiple bilateral oblique angiograms of the head were obtained. FINDINGS: 1. Normal caliber of the right common femoral artery, adequate for vascular access. 2. Left CCA angiograms: Cervical angiograms show normal course and caliber of the visualized left common carotid and internal carotid arteries. There are no significant stenoses. 3. Left ICA angiograms: There is brisk vascular contrast filling of the left ACA and MCA vascular trees. Luminal caliber is smooth and tapering. No aneurysms or abnormally high-flow, early draining veins are seen. No regions of  abnormal hypervascularity are noted. No opacification of the left transverse and sigmoid sinuses. Other dural sinuses are patent. Evidence of dilated veins along the left cerebral hemisphere, suggesting venous hypertension. 4. Left ECA and selective left occipital artery angiograms: Early venous drainage related to dural AV fistula at the left transverse-sigmoid sinus junction. The facial a supplied by the petrosal and petrosquamosal branch from the left middle meningeal artery, and branches of the left occipital artery. The fistula drains into a small patent segment of the sinus at the left transverse-sigmoid junction and into the vein of Labbe. There is cortical venous reflux and ectasia. 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 PROCEDURE: The Berenstein 2 catheter was  removed. The cerebase guide catheter remaining in the left common carotid artery. Using biplane roadmap guidance, an Apro 55 distal access catheter was navigated into the left occipital artery. Frontal and lateral angiograms of the head were obtained. Then, a headway duo was navigated over a synchro 10 micro guidewire into a proximal distal left occipital artery branch. Frontal and lateral angiograms were obtained. The microcatheter was subsequently advanced into the arterial feeders. The microcatheter was then prepped with DMSO. This was followed by slow controlled infusion of onyx 34 under fluoroscopy. Control angiograms were obtained. The catheter was retracted proximally into the left occipital artery branch. Additional embolization with onyx 34 was performed. Control angiograms were obtained. Partial occlusion of arterial feeders was noted. The mcrocatheter was removed and discarded. Follow-up frontal and lateral angiograms with left occipital artery contrast injections showed persistent occipital feeder proximal to the embolized branches. A headway duo microcatheter was then navigated to the origin of this feeder. Embolization was performed with the detachable coils. Control angiogram showed occlusion of the coiled branch. Left occipital artery angiogram showed persistent arterial feeders from a distal branch of the left occipital artery. Using biplane roadmap guidance, the catheter was advanced to the level of the feeding branch. The microcatheter was prepped with DMSO. Then, sow controlled infusion of on externalize fall was performed under fluoroscopy. Control angiograms showed no further arterial supply from the occipital artery. The microcatheter was then removed and discarded. The distal access catheter was retracted into the left external carotid artery. Frontal and lateral angiograms of the head were obtained showing prominent arterial supply from left middle meningeal artery branches. Using biplane  roadmap guidance, an echelon 10 microcatheter was navigated over the wire into the petrosquamous branch of the left middle meningeal artery near fistulous point. Frontal and lateral angiograms with microcatheter injection were obtained showing adequate microcatheter position. The microcatheter was then wrapped with DMSO. Then, slow controlled infusion of onyx 34 followed by onyx 18 was performed under fluoroscopy. The microcatheter was then removed and discarded. Proximal left middle meningeal artery angiograms with frontal and lateral views of the head showed occlusion of the supply to the fistula by the petrosquamous branch with persistent prominent supply from the petrous branch. Using biplane roadmap guidance, a headway duo microcatheter was navigated over the wire into the petrosal branch of the left middle meningeal artery near fistulous point. Frontal and lateral angiograms with microcatheter injection were obtained showing adequate microcatheter position. The microcatheter was then wrapped with DMSO. Then, slow controlled infusion of onyx 180was performed under fluoroscopy. The microcatheter was then removed and discarded. Follow-up left internal maxillary artery angiograms with frontal and lateral of the head showed complete occlusion of the left middle meningeal artery supply to the fistula. Left external carotid artery angiograms with frontal  and lateral views of the head showed no opacification of the previously seen dural AV fistula. The catheter was then placed into the left internal carotid artery. Frontal and lateral angiograms of the head were obtained. No evidence of thromboembolic complication or fistula opacification. Anterograde flow in the left vein of Labbe base noted. The catheter was then placed into the left vertebral artery. Frontal and lateral angiograms of the head root obtained. No evidence of nontarget embolization or fistula opacification. The catheter was subsequently withdrawn. Flat  panel CT of the head was obtained and post processed in a separate workstation with concurrent attending physician supervision. Selected images were sent to PACS. No evidence of hemorrhagic complication noted. Right common femoral artery angiogram was obtained in right anterior oblique view. The puncture is at the level of the common femoral artery. The artery has normal caliber, adequate for closure device. The sheath was exchanged over the wire for an 8 [place] Angio-Seal which was utilized for access closure. Immediate hemostasis was achieved. 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 PLAN: Follow-up angiogram in 6 months. Electronically Signed   By: Baldemar Lenis M.D.   On: 07/23/2022 12:58   IR US Guide Vasc Access Right  Result Date: 07/23/2022 INDICATION: 57 year old male presenting with fluctuating levels of altered mental status since 07/15/2022. 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 EXAM: ULTRASOUND-GUIDED VASCULAR ACCESS DIAGNOSTIC CEREBRAL ANGIOGRAM ENDOVASCULAR AV FISTULA EMBOLIZATION FLAT PANEL HEAD CT COMPARISON:  Cerebral angiogram July 20, 2022. MEDICATIONS: Ancef 2 gm IV. The antibiotic was administered within 1 hour of the procedure ANESTHESIA/SEDATION: The procedure was performed under general anesthesia. CONTRAST:  190 mL of Omnipaque 300 milligram/mL FLUOROSCOPY: Radiation Exposure Index (as provided by the fluoroscopic device): 4,979 mGy Kerma COMPLICATIONS:  None immediate. TECHNIQUE: Informed written consent was obtained from the patient after a thorough discussion of the procedural risks, benefits and alternatives. All questions were addressed. Maximal Sterile Barrier Technique was utilized including caps, mask, sterile gowns, sterile gloves, sterile drape, hand hygiene and skin antiseptic. A timeout was performed prior to the initiation of the procedure. The right groin was prepped and draped in the usual sterile fashion. Using a micropuncture kit and the modified Seldinger technique, access was gained to the right common femoral artery and an 8 French sheath was placed. Real-time ultrasound guidance was utilized for vascular access including the acquisition of a permanent ultrasound image documenting patency of the accessed vessel. Under fluoroscopy, an 8 [place] Cerebase guide catheter was navigated over a 6 [place] Berenstein 2 catheter and a 0.035" Terumo Glidewire into the aortic arch. The catheter was placed into the left common carotid artery. Frontal and lateral angiograms of the neck were obtained. Using biplane roadmap guidance, the catheter was advanced into the left internal carotid artery. The diagnostic catheter was removed. Frontal and lateral angiograms of the head were obtained. The catheter was retracted into there left common carotid artery. Under biplane roadmap guidance, the catheter was advanced into the left external carotid artery. Frontal and lateral angiograms of the head were obtained. The  catheter was then selectively placed into the left occipital artery. Frontal, lateral and multiple bilateral oblique angiograms of the head were obtained. FINDINGS: 1. Normal caliber of the right common femoral artery, adequate for vascular access. 2. Left CCA angiograms: Cervical angiograms show normal course and caliber of the visualized left common carotid and internal carotid arteries. There are no significant stenoses. 3. Left ICA angiograms: There is  brisk vascular contrast filling of the left ACA and MCA vascular trees. Luminal caliber is smooth and tapering. No aneurysms or abnormally high-flow, early draining veins are seen. No regions of abnormal hypervascularity are noted. No opacification of the left transverse and sigmoid sinuses. Other dural sinuses are patent. Evidence of dilated veins along the left cerebral hemisphere, suggesting venous hypertension. 4. Left ECA and selective left occipital artery angiograms: Early venous drainage related to dural AV fistula at the left transverse-sigmoid sinus junction. The facial a supplied by the petrosal and petrosquamosal branch from the left middle meningeal artery, and branches of the left occipital artery. The fistula drains into a small patent segment of the sinus at the left transverse-sigmoid junction and into the vein of Labbe. There is cortical venous reflux and ectasia. 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 PROCEDURE: The Berenstein 2 catheter was removed. The cerebase guide catheter remaining in the left common carotid artery. Using biplane roadmap guidance, an Apro 55 distal access catheter was navigated into the left occipital artery. Frontal and lateral angiograms of the head were obtained. Then, a headway duo was navigated over a synchro 10 micro guidewire into a proximal distal left occipital artery branch. Frontal and lateral angiograms were obtained. The microcatheter was subsequently advanced into the arterial feeders. The microcatheter was then prepped with DMSO. This was followed by slow controlled infusion of onyx 34 under fluoroscopy. Control angiograms were obtained. The catheter was retracted proximally into the left occipital artery branch. Additional embolization with onyx 34 was performed. Control angiograms were obtained. Partial occlusion of arterial feeders was noted. The mcrocatheter was removed and discarded. Follow-up frontal and lateral angiograms with  left occipital artery contrast injections showed persistent occipital feeder proximal to the embolized branches. A headway duo microcatheter was then navigated to the origin of this feeder. Embolization was performed with the detachable coils. Control angiogram showed occlusion of the coiled branch. Left occipital artery angiogram showed persistent arterial feeders from a distal branch of the left occipital artery. Using biplane roadmap guidance, the catheter was advanced to the level of the feeding branch. The microcatheter was prepped with DMSO. Then, sow controlled infusion of on externalize fall was performed under fluoroscopy. Control angiograms showed no further arterial supply from the occipital artery. The microcatheter was then removed and discarded. The distal access catheter was retracted into the left external carotid artery. Frontal and lateral angiograms of the head were obtained showing prominent arterial supply from left middle meningeal artery branches. Using biplane roadmap guidance, an echelon 10 microcatheter was navigated over the wire into the petrosquamous branch of the left middle meningeal artery near fistulous point. Frontal and lateral angiograms with microcatheter injection were obtained showing adequate microcatheter position. The microcatheter was then wrapped with DMSO. Then, slow controlled infusion of onyx 34 followed by onyx 18 was performed under fluoroscopy. The microcatheter was then removed and discarded. Proximal left middle meningeal artery angiograms with frontal and lateral views of the head showed occlusion of the supply to the fistula by the petrosquamous branch with persistent prominent supply from  the petrous branch. Using biplane roadmap guidance, a headway duo microcatheter was navigated over the wire into the petrosal branch of the left middle meningeal artery near fistulous point. Frontal and lateral angiograms with microcatheter injection were obtained showing  adequate microcatheter position. The microcatheter was then wrapped with DMSO. Then, slow controlled infusion of onyx 180was performed under fluoroscopy. The microcatheter was then removed and discarded. Follow-up left internal maxillary artery angiograms with frontal and lateral of the head showed complete occlusion of the left middle meningeal artery supply to the fistula. Left external carotid artery angiograms with frontal and lateral views of the head showed no opacification of the previously seen dural AV fistula. The catheter was then placed into the left internal carotid artery. Frontal and lateral angiograms of the head were obtained. No evidence of thromboembolic complication or fistula opacification. Anterograde flow in the left vein of Labbe base noted. The catheter was then placed into the left vertebral artery. Frontal and lateral angiograms of the head root obtained. No evidence of nontarget embolization or fistula opacification. The catheter was subsequently withdrawn. Flat panel CT of the head was obtained and post processed in a separate workstation with concurrent attending physician supervision. Selected images were sent to PACS. No evidence of hemorrhagic complication noted. Right common femoral artery angiogram was obtained in right anterior oblique view. The puncture is at the level of the common femoral artery. The artery has normal caliber, adequate for closure device. The sheath was exchanged over the wire for an 8 [place] Angio-Seal which was utilized for access closure. Immediate hemostasis was achieved. 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 PLAN: Follow-up angiogram in 6 months. Electronically Signed   By: Baldemar Lenis M.D.   On: 07/23/2022 12:58   IR Angiogram Follow Up Study  Result Date: 07/23/2022 INDICATION: 57 year old male presenting with fluctuating levels  of altered mental status since 07/15/2022. 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 EXAM: ULTRASOUND-GUIDED VASCULAR ACCESS DIAGNOSTIC CEREBRAL ANGIOGRAM ENDOVASCULAR AV FISTULA EMBOLIZATION FLAT PANEL HEAD CT COMPARISON:  Cerebral angiogram July 20, 2022. MEDICATIONS: Ancef 2 gm IV. The antibiotic was administered within 1 hour of the procedure ANESTHESIA/SEDATION: The procedure was performed under general anesthesia. CONTRAST:  190 mL of Omnipaque 300 milligram/mL FLUOROSCOPY: Radiation Exposure Index (as provided by the fluoroscopic device): 4,979 mGy Kerma COMPLICATIONS: None immediate. TECHNIQUE: Informed written consent was obtained from the patient after a thorough discussion of the procedural risks, benefits and alternatives. All questions were addressed. Maximal Sterile Barrier Technique was utilized including caps, mask, sterile gowns, sterile gloves, sterile drape, hand hygiene and skin antiseptic. A timeout was performed prior to the initiation of the procedure. The right groin was prepped and draped in the usual sterile fashion. Using a micropuncture kit and the modified Seldinger technique, access was gained to the right common femoral artery and an 8 French sheath was placed. Real-time ultrasound guidance was utilized for vascular access including the acquisition of a permanent ultrasound image documenting patency of the accessed vessel. Under fluoroscopy, an 8 [place] Cerebase guide catheter was  navigated over a 6 [place] Berenstein 2 catheter and a 0.035" Terumo Glidewire into the aortic  arch. The catheter was placed into the left common carotid artery. Frontal and lateral angiograms of the neck were obtained. Using biplane roadmap guidance, the catheter was advanced into the left internal carotid artery. The diagnostic catheter was removed. Frontal and lateral angiograms of the head were obtained. The catheter was retracted into there left common carotid artery. Under biplane roadmap guidance, the catheter was advanced into the left external carotid artery. Frontal and lateral angiograms of the head were obtained. The catheter was then selectively placed into the left occipital artery. Frontal, lateral and multiple bilateral oblique angiograms of the head were obtained. FINDINGS: 1. Normal caliber of the right common femoral artery, adequate for vascular access. 2. Left CCA angiograms: Cervical angiograms show normal course and caliber of the visualized left common carotid and internal carotid arteries. There are no significant stenoses. 3. Left ICA angiograms: There is brisk vascular contrast filling of the left ACA and MCA vascular trees. Luminal caliber is smooth and tapering. No aneurysms or abnormally high-flow, early draining veins are seen. No regions of abnormal hypervascularity are noted. No opacification of the left transverse and sigmoid sinuses. Other dural sinuses are patent. Evidence of dilated veins along the left cerebral hemisphere, suggesting venous hypertension. 4. Left ECA and selective left occipital artery angiograms: Early venous drainage related to dural AV fistula at the left transverse-sigmoid sinus junction. The facial a supplied by the petrosal and petrosquamosal branch from the left middle meningeal artery, and branches of the left occipital artery. The fistula drains into a small patent segment of the sinus at the left transverse-sigmoid junction and into the vein of Labbe. There is cortical venous reflux and ectasia. 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 PROCEDURE: The Berenstein 2 catheter was removed. The cerebase guide catheter remaining in the left common carotid artery. Using biplane roadmap guidance, an Apro 55 distal access catheter was navigated into the left occipital artery. Frontal and lateral angiograms of the head were obtained. Then, a headway duo was navigated over a synchro 10 micro guidewire into a proximal distal left occipital artery branch. Frontal and lateral angiograms were obtained. The microcatheter was subsequently advanced into the arterial feeders. The microcatheter was then prepped with DMSO. This was followed by slow controlled infusion of onyx 34 under fluoroscopy. Control angiograms were obtained. The catheter was retracted proximally into the left occipital artery branch. Additional embolization with onyx 34 was performed. Control angiograms were obtained. Partial occlusion of arterial feeders was noted. The mcrocatheter was removed and discarded. Follow-up frontal and lateral angiograms with left occipital artery contrast injections showed persistent occipital feeder proximal to the embolized branches. A headway duo microcatheter was then navigated to the origin of this feeder. Embolization was performed with the detachable coils. Control angiogram showed occlusion of the coiled branch. Left occipital artery angiogram showed persistent arterial feeders from a distal branch of the left occipital artery. Using biplane roadmap guidance, the catheter was advanced to the level of the feeding branch. The microcatheter was prepped with DMSO. Then, sow controlled infusion of on externalize fall was performed under fluoroscopy. Control angiograms showed no further arterial supply from the occipital artery. The microcatheter was then removed and discarded. The distal access catheter was retracted into the left external carotid artery. Frontal and lateral angiograms of the head were obtained showing prominent arterial supply  from left middle meningeal artery branches.  Using biplane roadmap guidance, an echelon 10 microcatheter was navigated over the wire into the petrosquamous branch of the left middle meningeal artery near fistulous point. Frontal and lateral angiograms with microcatheter injection were obtained showing adequate microcatheter position. The microcatheter was then wrapped with DMSO. Then, slow controlled infusion of onyx 34 followed by onyx 18 was performed under fluoroscopy. The microcatheter was then removed and discarded. Proximal left middle meningeal artery angiograms with frontal and lateral views of the head showed occlusion of the supply to the fistula by the petrosquamous branch with persistent prominent supply from the petrous branch. Using biplane roadmap guidance, a headway duo microcatheter was navigated over the wire into the petrosal branch of the left middle meningeal artery near fistulous point. Frontal and lateral angiograms with microcatheter injection were obtained showing adequate microcatheter position. The microcatheter was then wrapped with DMSO. Then, slow controlled infusion of onyx 180was performed under fluoroscopy. The microcatheter was then removed and discarded. Follow-up left internal maxillary artery angiograms with frontal and lateral of the head showed complete occlusion of the left middle meningeal artery supply to the fistula. Left external carotid artery angiograms with frontal and lateral views of the head showed no opacification of the previously seen dural AV fistula. The catheter was then placed into the left internal carotid artery. Frontal and lateral angiograms of the head were obtained. No evidence of thromboembolic complication or fistula opacification. Anterograde flow in the left vein of Labbe base noted. The catheter was then placed into the left vertebral artery. Frontal and lateral angiograms of the head root obtained. No evidence of nontarget embolization or fistula  opacification. The catheter was subsequently withdrawn. Flat panel CT of the head was obtained and post processed in a separate workstation with concurrent attending physician supervision. Selected images were sent to PACS. No evidence of hemorrhagic complication noted. Right common femoral artery angiogram was obtained in right anterior oblique view. The puncture is at the level of the common femoral artery. The artery has normal caliber, adequate for closure device. The sheath was exchanged over the wire for an 8 [place] Angio-Seal which was utilized for access closure. Immediate hemostasis was achieved. 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 PLAN: Follow-up angiogram in 6 months. Electronically Signed   By: Baldemar Lenis M.D.   On: 07/23/2022 12:58   IR Angiogram Follow Up Study  Result Date: 07/23/2022 INDICATION: 57 year old male presenting with fluctuating levels of altered mental status since 07/15/2022. 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 EXAM: ULTRASOUND-GUIDED VASCULAR ACCESS DIAGNOSTIC CEREBRAL ANGIOGRAM ENDOVASCULAR AV FISTULA EMBOLIZATION FLAT PANEL HEAD CT COMPARISON:  Cerebral angiogram July 20, 2022. MEDICATIONS: Ancef 2 gm IV. The antibiotic was administered within 1 hour of the procedure ANESTHESIA/SEDATION: The procedure was performed under general anesthesia. CONTRAST:  190 mL of Omnipaque 300 milligram/mL FLUOROSCOPY: Radiation Exposure Index (as provided  by the fluoroscopic device): 4,979 mGy Kerma COMPLICATIONS: None immediate. TECHNIQUE: Informed written consent was obtained from the patient after a thorough discussion of the procedural risks, benefits and alternatives. All questions were addressed.  Maximal Sterile Barrier Technique was utilized including caps, mask, sterile gowns, sterile gloves, sterile drape, hand hygiene and skin antiseptic. A timeout was performed prior to the initiation of the procedure. The right groin was prepped and draped in the usual sterile fashion. Using a micropuncture kit and the modified Seldinger technique, access was gained to the right common femoral artery and an 8 French sheath was placed. Real-time ultrasound guidance was utilized for vascular access including the acquisition of a permanent ultrasound image documenting patency of the accessed vessel. Under fluoroscopy, an 8 [place] Cerebase guide catheter was navigated over a 6 [place] Berenstein 2 catheter and a 0.035" Terumo Glidewire into the aortic arch. The catheter was placed into the left common carotid artery. Frontal and lateral angiograms of the neck were obtained. Using biplane roadmap guidance, the catheter was advanced into the left internal carotid artery. The diagnostic catheter was removed. Frontal and lateral angiograms of the head were obtained. The catheter was retracted into there left common carotid artery. Under biplane roadmap guidance, the catheter was advanced into the left external carotid artery. Frontal and lateral angiograms of the head were obtained. The catheter was then selectively placed into the left occipital artery. Frontal, lateral and multiple bilateral oblique angiograms of the head were obtained. FINDINGS: 1. Normal caliber of the right common femoral artery, adequate for vascular access. 2. Left CCA angiograms: Cervical angiograms show normal course and caliber of the visualized left common carotid and internal carotid arteries. There  are no significant stenoses. 3. Left ICA angiograms: There is brisk vascular contrast filling of the left ACA and MCA vascular trees. Luminal caliber is smooth and tapering. No aneurysms or abnormally high-flow, early draining veins are seen. No regions of abnormal hypervascularity are noted. No opacification of the left transverse and sigmoid sinuses. Other dural sinuses are patent. Evidence of dilated veins along the left cerebral hemisphere, suggesting venous hypertension. 4. Left ECA and selective left occipital artery angiograms: Early venous drainage related to dural AV fistula at the left transverse-sigmoid sinus junction. The facial a supplied by the petrosal and petrosquamosal branch from the left middle meningeal artery, and branches of the left occipital artery. The fistula drains into a small patent segment of the sinus at the left transverse-sigmoid junction and into the vein of Labbe. There is cortical venous reflux and ectasia. 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 PROCEDURE: The Berenstein 2 catheter was removed. The cerebase guide catheter remaining in the left common carotid artery. Using biplane roadmap guidance, an Apro 55 distal access catheter was navigated into the left occipital artery. Frontal and lateral angiograms of the head were obtained. Then, a headway duo was navigated over a synchro 10 micro guidewire into a proximal distal left occipital artery branch. Frontal and lateral angiograms were obtained. The microcatheter was subsequently advanced into the arterial feeders. The microcatheter was then prepped with DMSO. This was followed by slow controlled infusion of onyx 34 under fluoroscopy. Control angiograms were obtained. The catheter was retracted proximally into the left occipital artery branch. Additional embolization with onyx 34 was performed. Control angiograms were obtained. Partial occlusion of arterial feeders was noted. The mcrocatheter was removed  and discarded. Follow-up frontal and lateral angiograms with left occipital artery contrast injections showed persistent occipital feeder proximal to the embolized branches. A headway duo microcatheter was then navigated to the origin of this feeder. Embolization was performed with the detachable coils. Control angiogram showed occlusion of the coiled branch. Left  occipital artery angiogram showed persistent arterial feeders from a distal branch of the left occipital artery. Using biplane roadmap guidance, the catheter was advanced to the level of the feeding branch. The microcatheter was prepped with DMSO. Then, sow controlled infusion of on externalize fall was performed under fluoroscopy. Control angiograms showed no further arterial supply from the occipital artery. The microcatheter was then removed and discarded. The distal access catheter was retracted into the left external carotid artery. Frontal and lateral angiograms of the head were obtained showing prominent arterial supply from left middle meningeal artery branches. Using biplane roadmap guidance, an echelon 10 microcatheter was navigated over the wire into the petrosquamous branch of the left middle meningeal artery near fistulous point. Frontal and lateral angiograms with microcatheter injection were obtained showing adequate microcatheter position. The microcatheter was then wrapped with DMSO. Then, slow controlled infusion of onyx 34 followed by onyx 18 was performed under fluoroscopy. The microcatheter was then removed and discarded. Proximal left middle meningeal artery angiograms with frontal and lateral views of the head showed occlusion of the supply to the fistula by the petrosquamous branch with persistent prominent supply from the petrous branch. Using biplane roadmap guidance, a headway duo microcatheter was navigated over the wire into the petrosal branch of the left middle meningeal artery near fistulous point. Frontal and lateral  angiograms with microcatheter injection were obtained showing adequate microcatheter position. The microcatheter was then wrapped with DMSO. Then, slow controlled infusion of onyx 180was performed under fluoroscopy. The microcatheter was then removed and discarded. Follow-up left internal maxillary artery angiograms with frontal and lateral of the head showed complete occlusion of the left middle meningeal artery supply to the fistula. Left external carotid artery angiograms with frontal and lateral views of the head showed no opacification of the previously seen dural AV fistula. The catheter was then placed into the left internal carotid artery. Frontal and lateral angiograms of the head were obtained. No evidence of thromboembolic complication or fistula opacification. Anterograde flow in the left vein of Labbe base noted. The catheter was then placed into the left vertebral artery. Frontal and lateral angiograms of the head root obtained. No evidence of nontarget embolization or fistula opacification. The catheter was subsequently withdrawn. Flat panel CT of the head was obtained and post processed in a separate workstation with concurrent attending physician supervision. Selected images were sent to PACS. No evidence of hemorrhagic complication noted. Right common femoral artery angiogram was obtained in right anterior oblique view. The puncture is at the level of the common femoral artery. The artery has normal caliber, adequate for closure device. The sheath was exchanged over the wire for an 8 [place] Angio-Seal which was utilized for access closure. Immediate hemostasis was achieved. 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 PLAN: Follow-up angiogram in 6 months. Electronically Signed   By: Baldemar Lenis M.D.   On: 07/23/2022 12:58   IR Angiogram Follow Up Study  Result Date:  07/23/2022 INDICATION: 57 year old male presenting with fluctuating levels of altered mental status since 07/15/2022. 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 He returns today for endovascular  embolization of dural AV fistula. EXAM: ULTRASOUND-GUIDED VASCULAR ACCESS DIAGNOSTIC CEREBRAL ANGIOGRAM ENDOVASCULAR AV FISTULA EMBOLIZATION FLAT PANEL HEAD CT COMPARISON:  Cerebral angiogram July 20, 2022. MEDICATIONS: Ancef 2 gm IV. The antibiotic was administered within 1 hour of the procedure ANESTHESIA/SEDATION: The procedure was performed under general anesthesia. CONTRAST:  190 mL of Omnipaque 300 milligram/mL FLUOROSCOPY: Radiation Exposure Index (as provided by the fluoroscopic device): 4,979 mGy Kerma COMPLICATIONS: None immediate. TECHNIQUE: Informed written consent was obtained from the patient after a thorough discussion of the procedural risks, benefits and alternatives. All questions were addressed. Maximal Sterile Barrier Technique was utilized including caps, mask, sterile gowns, sterile gloves, sterile drape, hand hygiene and skin antiseptic. A timeout was performed prior to the initiation of the procedure. The right groin was prepped and draped in the usual sterile fashion. Using a micropuncture kit and the modified Seldinger technique, access was gained to the right common femoral artery and an 8 French sheath was placed. Real-time ultrasound guidance was utilized for vascular access including the acquisition of a permanent ultrasound image documenting patency of the accessed vessel. Under fluoroscopy, an 8 [place] Cerebase guide catheter was navigated over a 6  [place] Berenstein 2 catheter and a 0.035" Terumo Glidewire into the aortic arch. The catheter was placed into the left common carotid artery. Frontal and lateral angiograms of the neck were obtained. Using biplane roadmap guidance, the catheter was advanced into the left internal carotid artery. The diagnostic catheter was removed. Frontal and lateral angiograms of the head were obtained. The catheter was retracted into there left common carotid artery. Under biplane roadmap guidance, the catheter was advanced into the left external carotid artery. Frontal and lateral angiograms of the head were obtained. The catheter was then selectively placed into the left occipital artery. Frontal, lateral and multiple bilateral oblique angiograms of the head were obtained. FINDINGS: 1. Normal caliber of the right common femoral artery, adequate for vascular access. 2. Left CCA angiograms: Cervical angiograms show normal course and caliber of the visualized left common carotid and internal carotid arteries. There are no significant stenoses. 3. Left ICA angiograms: There is brisk vascular contrast filling of the left ACA and MCA vascular trees. Luminal caliber is smooth and tapering. No aneurysms or abnormally high-flow, early draining veins are seen. No regions of abnormal hypervascularity are noted. No opacification of the left transverse and sigmoid sinuses. Other dural sinuses are patent. Evidence of dilated veins along the left cerebral hemisphere, suggesting venous hypertension. 4. Left ECA and selective left occipital artery angiograms: Early venous drainage related to dural AV fistula at the left transverse-sigmoid sinus junction. The facial a supplied by the petrosal and petrosquamosal branch from the left middle meningeal artery, and branches of the left occipital artery. The fistula drains into a small patent segment of the sinus at the left transverse-sigmoid junction and into the vein of Labbe. There is cortical  venous reflux and ectasia. 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 PROCEDURE: The Berenstein 2 catheter was removed. The cerebase guide catheter remaining in the left common carotid artery. Using biplane roadmap guidance, an Apro 55 distal access catheter was navigated into the left occipital artery. Frontal and lateral angiograms of the head were obtained. Then, a headway duo was navigated over a synchro 10 micro guidewire into a proximal distal left occipital artery branch. Frontal and lateral angiograms were obtained. The microcatheter was subsequently advanced into the arterial feeders. The microcatheter was then prepped with DMSO. This was  followed by slow controlled infusion of onyx 34 under fluoroscopy. Control angiograms were obtained. The catheter was retracted proximally into the left occipital artery branch. Additional embolization with onyx 34 was performed. Control angiograms were obtained. Partial occlusion of arterial feeders was noted. The mcrocatheter was removed and discarded. Follow-up frontal and lateral angiograms with left occipital artery contrast injections showed persistent occipital feeder proximal to the embolized branches. A headway duo microcatheter was then navigated to the origin of this feeder. Embolization was performed with the detachable coils. Control angiogram showed occlusion of the coiled branch. Left occipital artery angiogram showed persistent arterial feeders from a distal branch of the left occipital artery. Using biplane roadmap guidance, the catheter was advanced to the level of the feeding branch. The microcatheter was prepped with DMSO. Then, sow controlled infusion of on externalize fall was performed under fluoroscopy. Control angiograms showed no further arterial supply from the occipital artery. The microcatheter was then removed and discarded. The distal access catheter was retracted into the left external carotid artery. Frontal and  lateral angiograms of the head were obtained showing prominent arterial supply from left middle meningeal artery branches. Using biplane roadmap guidance, an echelon 10 microcatheter was navigated over the wire into the petrosquamous branch of the left middle meningeal artery near fistulous point. Frontal and lateral angiograms with microcatheter injection were obtained showing adequate microcatheter position. The microcatheter was then wrapped with DMSO. Then, slow controlled infusion of onyx 34 followed by onyx 18 was performed under fluoroscopy. The microcatheter was then removed and discarded. Proximal left middle meningeal artery angiograms with frontal and lateral views of the head showed occlusion of the supply to the fistula by the petrosquamous branch with persistent prominent supply from the petrous branch. Using biplane roadmap guidance, a headway duo microcatheter was navigated over the wire into the petrosal branch of the left middle meningeal artery near fistulous point. Frontal and lateral angiograms with microcatheter injection were obtained showing adequate microcatheter position. The microcatheter was then wrapped with DMSO. Then, slow controlled infusion of onyx 180was performed under fluoroscopy. The microcatheter was then removed and discarded. Follow-up left internal maxillary artery angiograms with frontal and lateral of the head showed complete occlusion of the left middle meningeal artery supply to the fistula. Left external carotid artery angiograms with frontal and lateral views of the head showed no opacification of the previously seen dural AV fistula. The catheter was then placed into the left internal carotid artery. Frontal and lateral angiograms of the head were obtained. No evidence of thromboembolic complication or fistula opacification. Anterograde flow in the left vein of Labbe base noted. The catheter was then placed into the left vertebral artery. Frontal and lateral angiograms  of the head root obtained. No evidence of nontarget embolization or fistula opacification. The catheter was subsequently withdrawn. Flat panel CT of the head was obtained and post processed in a separate workstation with concurrent attending physician supervision. Selected images were sent to PACS. No evidence of hemorrhagic complication noted. Right common femoral artery angiogram was obtained in right anterior oblique view. The puncture is at the level of the common femoral artery. The artery has normal caliber, adequate for closure device. The sheath was exchanged over the wire for an 8 [place] Angio-Seal which was utilized for access closure. Immediate hemostasis was achieved. 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 PLAN: Follow-up angiogram in 6 months. Electronically Signed   By: Dorise Bullion  Quay Burow M.D.   On: 07/23/2022 12:58   IR Angiogram Follow Up Study  Result Date: 07/23/2022 INDICATION: 57 year old male presenting with fluctuating levels of altered mental status since 07/15/2022. 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 EXAM: ULTRASOUND-GUIDED VASCULAR ACCESS DIAGNOSTIC CEREBRAL ANGIOGRAM ENDOVASCULAR AV FISTULA EMBOLIZATION FLAT PANEL HEAD CT COMPARISON:  Cerebral angiogram July 20, 2022. MEDICATIONS: Ancef 2 gm IV. The antibiotic was administered within 1 hour of the procedure ANESTHESIA/SEDATION: The procedure was performed under general anesthesia. CONTRAST:  190 mL of  Omnipaque 300 milligram/mL FLUOROSCOPY: Radiation Exposure Index (as provided by the fluoroscopic device): 4,979 mGy Kerma COMPLICATIONS: None immediate. TECHNIQUE: Informed written consent was obtained from the patient after a thorough discussion of the procedural risks, benefits and alternatives. All questions were addressed. Maximal Sterile Barrier Technique was utilized including caps, mask, sterile gowns, sterile gloves, sterile drape, hand hygiene and skin antiseptic. A timeout was performed prior to the initiation of the procedure. The right groin was prepped and draped in the usual sterile fashion. Using a micropuncture kit and the modified Seldinger technique, access was gained to the right common femoral artery and an 8 French sheath was placed. Real-time ultrasound guidance was utilized for vascular access including the acquisition of a permanent ultrasound image documenting patency of the accessed vessel. Under fluoroscopy, an 8 [place] Cerebase guide catheter was navigated over a 6 [place] Berenstein 2 catheter and a 0.035" Terumo Glidewire into the aortic arch. The catheter was placed into the left common carotid artery. Frontal and lateral angiograms of the neck were obtained. Using biplane roadmap guidance, the catheter was advanced into the left internal carotid artery. The diagnostic catheter was removed. Frontal and lateral angiograms of the head were obtained. The catheter was retracted into there left common carotid artery. Under biplane roadmap guidance, the catheter was advanced into the left external carotid artery. Frontal and lateral angiograms of the head were obtained. The catheter was then selectively placed into the left occipital artery. Frontal, lateral and multiple bilateral oblique angiograms of the head were obtained. FINDINGS: 1. Normal caliber of the right common femoral artery, adequate for vascular access. 2. Left CCA angiograms: Cervical angiograms show normal course and caliber  of the visualized left common carotid and internal carotid arteries. There are no significant stenoses. 3. Left ICA angiograms: There is brisk vascular contrast filling of the left ACA and MCA vascular trees. Luminal caliber is smooth and tapering. No aneurysms or abnormally high-flow, early draining veins are seen. No regions of abnormal hypervascularity are noted. No opacification of the left transverse and sigmoid sinuses. Other dural sinuses are patent. Evidence of dilated veins along the left cerebral hemisphere, suggesting venous hypertension. 4. Left ECA and selective left occipital artery angiograms: Early venous drainage related to dural AV fistula at the left transverse-sigmoid sinus junction. The facial a supplied by the petrosal and petrosquamosal branch from the left middle meningeal artery, and branches of the left occipital artery. The fistula drains into a small patent segment of the sinus at the left transverse-sigmoid junction and into the vein of Labbe. There is cortical venous reflux and ectasia. 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 PROCEDURE: The Berenstein 2 catheter was removed. The cerebase guide catheter remaining in the left common carotid artery. Using biplane roadmap guidance, an Apro 55 distal access catheter was navigated into the left occipital artery. Frontal and lateral angiograms of the head were obtained. Then, a headway duo was navigated over a synchro 10 micro guidewire into a proximal distal left occipital artery branch. Frontal and lateral angiograms were obtained. The microcatheter was subsequently advanced into the arterial feeders. The microcatheter was then prepped with DMSO. This was followed by slow controlled infusion of onyx 34 under fluoroscopy. Control angiograms were obtained. The catheter was retracted proximally into the left occipital artery branch. Additional embolization with onyx 34 was performed. Control angiograms were obtained.  Partial occlusion of arterial feeders was noted. The mcrocatheter was removed and discarded. Follow-up frontal and lateral angiograms with left occipital artery contrast injections showed persistent occipital feeder proximal to the embolized branches. A headway duo microcatheter was then navigated to the origin of this feeder. Embolization was performed with the detachable coils. Control angiogram showed occlusion of the coiled branch. Left occipital artery angiogram showed persistent arterial feeders from a distal branch of the left occipital artery. Using biplane roadmap guidance, the catheter was advanced to the level of the feeding branch. The microcatheter was prepped with DMSO. Then, sow controlled infusion of on externalize fall was performed under fluoroscopy. Control angiograms showed no further arterial supply from the occipital artery. The microcatheter was then removed and discarded. The distal access catheter was retracted into the left external carotid artery. Frontal and lateral angiograms of the head were obtained showing prominent arterial supply from left middle meningeal artery branches. Using biplane roadmap guidance, an echelon 10 microcatheter was navigated over the wire into the petrosquamous branch of the left middle meningeal artery near fistulous point. Frontal and lateral angiograms with microcatheter injection were obtained showing adequate microcatheter position. The microcatheter was then wrapped with DMSO. Then, slow controlled infusion of onyx 34 followed by onyx 18 was performed under fluoroscopy. The microcatheter was then removed and discarded. Proximal left middle meningeal artery angiograms with frontal and lateral views of the head showed occlusion of the supply to the fistula by the petrosquamous branch with persistent prominent supply from the petrous branch. Using biplane roadmap guidance, a headway duo microcatheter was navigated over the wire into the petrosal branch of the  left middle meningeal artery near fistulous point. Frontal and lateral angiograms with microcatheter injection were obtained showing adequate microcatheter position. The microcatheter was then wrapped with DMSO. Then, slow controlled infusion of onyx 180was performed under fluoroscopy. The microcatheter was then removed and discarded. Follow-up left internal maxillary artery angiograms with frontal and lateral of the head showed complete occlusion of the left middle meningeal artery supply to the fistula. Left external carotid artery angiograms with frontal and lateral views of the head showed no opacification of the previously seen dural AV fistula. The catheter was then placed into the left internal carotid artery. Frontal and lateral angiograms of the head were obtained. No evidence of thromboembolic complication or fistula opacification. Anterograde flow in the left vein of Labbe base noted. The catheter was then placed into the left vertebral artery. Frontal and lateral angiograms of the head root obtained. No evidence of nontarget embolization or fistula opacification. The catheter was subsequently withdrawn. Flat panel CT of the head was obtained and post processed in a separate workstation with concurrent attending physician supervision.  Selected images were sent to PACS. No evidence of hemorrhagic complication noted. Right common femoral artery angiogram was obtained in right anterior oblique view. The puncture is at the level of the common femoral artery. The artery has normal caliber, adequate for closure device. The sheath was exchanged over the wire for an 8 [place] Angio-Seal which was utilized for access closure. Immediate hemostasis was achieved. 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 PLAN: Follow-up angiogram in 6 months. Electronically Signed   By: Baldemar Lenis M.D.   On:  07/23/2022 12:58   IR Angiogram Follow Up Study  Result Date: 07/23/2022 INDICATION: 57 year old male presenting with fluctuating levels of altered mental status since 07/15/2022. 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 EXAM: ULTRASOUND-GUIDED VASCULAR ACCESS DIAGNOSTIC CEREBRAL ANGIOGRAM ENDOVASCULAR AV FISTULA EMBOLIZATION FLAT PANEL HEAD CT COMPARISON:  Cerebral angiogram July 20, 2022. MEDICATIONS: Ancef 2 gm IV. The antibiotic was administered within 1 hour of the procedure ANESTHESIA/SEDATION: The procedure was performed under general anesthesia. CONTRAST:  190 mL of Omnipaque 300 milligram/mL FLUOROSCOPY: Radiation Exposure Index (as provided by the fluoroscopic device): 4,979 mGy Kerma COMPLICATIONS: None immediate. TECHNIQUE: Informed written consent was obtained from the patient after a thorough discussion of the procedural risks, benefits and alternatives. All questions were addressed. Maximal Sterile Barrier Technique was utilized including caps, mask, sterile gowns, sterile gloves, sterile drape, hand hygiene and skin antiseptic. A timeout was performed prior to the initiation of the procedure. The right groin was prepped and draped in the usual sterile fashion. Using a micropuncture kit and the modified Seldinger technique, access was gained to the right common femoral artery and an 8 French sheath was placed. Real-time ultrasound guidance was utilized for vascular access including the acquisition of a permanent ultrasound image documenting patency of the accessed vessel. Under  fluoroscopy, an 8 [place] Cerebase guide catheter was navigated over a 6 [place] Berenstein 2 catheter and a 0.035" Terumo Glidewire into the aortic arch. The catheter was placed into the left common carotid artery. Frontal and lateral angiograms of the neck were obtained. Using biplane roadmap guidance, the catheter was advanced into the left internal carotid artery. The diagnostic catheter was removed. Frontal and lateral angiograms of the head were obtained. The catheter was retracted into there left common carotid artery. Under biplane roadmap guidance, the catheter was advanced into the left external carotid artery. Frontal and lateral angiograms of the head were obtained. The catheter was then selectively placed into the left occipital artery. Frontal, lateral and multiple bilateral oblique angiograms of the head were obtained. FINDINGS: 1. Normal caliber of the right common femoral artery, adequate for vascular access. 2. Left CCA angiograms: Cervical angiograms show normal course and caliber of the visualized left common carotid and internal carotid arteries. There are no significant stenoses. 3. Left ICA angiograms: There is brisk vascular contrast filling of the left ACA and MCA vascular trees. Luminal caliber is smooth and tapering. No aneurysms or abnormally high-flow, early draining veins are seen. No regions of abnormal hypervascularity are noted. No opacification of the  left transverse and sigmoid sinuses. Other dural sinuses are patent. Evidence of dilated veins along the left cerebral hemisphere, suggesting venous hypertension. 4. Left ECA and selective left occipital artery angiograms: Early venous drainage related to dural AV fistula at the left transverse-sigmoid sinus junction. The facial a supplied by the petrosal and petrosquamosal branch from the left middle meningeal artery, and branches of the left occipital artery. The fistula drains into a small patent segment of the sinus at the left  transverse-sigmoid junction and into the vein of Labbe. There is cortical venous reflux and ectasia. 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 PROCEDURE: The Berenstein 2 catheter was removed. The cerebase guide catheter remaining in the left common carotid artery. Using biplane roadmap guidance, an Apro 55 distal access catheter was navigated into the left occipital artery. Frontal and lateral angiograms of the head were obtained. Then, a headway duo was navigated over a synchro 10 micro guidewire into a proximal distal left occipital artery branch. Frontal and lateral angiograms were obtained. The microcatheter was subsequently advanced into the arterial feeders. The microcatheter was then prepped with DMSO. This was followed by slow controlled infusion of onyx 34 under fluoroscopy. Control angiograms were obtained. The catheter was retracted proximally into the left occipital artery branch. Additional embolization with onyx 34 was performed. Control angiograms were obtained. Partial occlusion of arterial feeders was noted. The mcrocatheter was removed and discarded. Follow-up frontal and lateral angiograms with left occipital artery contrast injections showed persistent occipital feeder proximal to the embolized branches. A headway duo microcatheter was then navigated to the origin of this feeder. Embolization was performed with the detachable coils. Control angiogram showed occlusion of the coiled branch. Left occipital artery angiogram showed persistent arterial feeders from a distal branch of the left occipital artery. Using biplane roadmap guidance, the catheter was advanced to the level of the feeding branch. The microcatheter was prepped with DMSO. Then, sow controlled infusion of on externalize fall was performed under fluoroscopy. Control angiograms showed no further arterial supply from the occipital artery. The microcatheter was then removed and discarded. The distal access  catheter was retracted into the left external carotid artery. Frontal and lateral angiograms of the head were obtained showing prominent arterial supply from left middle meningeal artery branches. Using biplane roadmap guidance, an echelon 10 microcatheter was navigated over the wire into the petrosquamous branch of the left middle meningeal artery near fistulous point. Frontal and lateral angiograms with microcatheter injection were obtained showing adequate microcatheter position. The microcatheter was then wrapped with DMSO. Then, slow controlled infusion of onyx 34 followed by onyx 18 was performed under fluoroscopy. The microcatheter was then removed and discarded. Proximal left middle meningeal artery angiograms with frontal and lateral views of the head showed occlusion of the supply to the fistula by the petrosquamous branch with persistent prominent supply from the petrous branch. Using biplane roadmap guidance, a headway duo microcatheter was navigated over the wire into the petrosal branch of the left middle meningeal artery near fistulous point. Frontal and lateral angiograms with microcatheter injection were obtained showing adequate microcatheter position. The microcatheter was then wrapped with DMSO. Then, slow controlled infusion of onyx 180was performed under fluoroscopy. The microcatheter was then removed and discarded. Follow-up left internal maxillary artery angiograms with frontal and lateral of the head showed complete occlusion of the left middle meningeal artery supply to the fistula. Left external carotid artery angiograms with frontal and lateral views of the head showed no  opacification of the previously seen dural AV fistula. The catheter was then placed into the left internal carotid artery. Frontal and lateral angiograms of the head were obtained. No evidence of thromboembolic complication or fistula opacification. Anterograde flow in the left vein of Labbe base noted. The catheter was  then placed into the left vertebral artery. Frontal and lateral angiograms of the head root obtained. No evidence of nontarget embolization or fistula opacification. The catheter was subsequently withdrawn. Flat panel CT of the head was obtained and post processed in a separate workstation with concurrent attending physician supervision. Selected images were sent to PACS. No evidence of hemorrhagic complication noted. Right common femoral artery angiogram was obtained in right anterior oblique view. The puncture is at the level of the common femoral artery. The artery has normal caliber, adequate for closure device. The sheath was exchanged over the wire for an 8 [place] Angio-Seal which was utilized for access closure. Immediate hemostasis was achieved. 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 PLAN: Follow-up angiogram in 6 months. Electronically Signed   By: Baldemar Lenis M.D.   On: 07/23/2022 12:58   IR Angiogram Follow Up Study  Result Date: 07/23/2022 INDICATION: 57 year old male presenting with fluctuating levels of altered mental status since 07/15/2022. 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 EXAM: ULTRASOUND-GUIDED VASCULAR ACCESS DIAGNOSTIC CEREBRAL ANGIOGRAM ENDOVASCULAR AV FISTULA EMBOLIZATION FLAT PANEL HEAD CT COMPARISON:  Cerebral angiogram July 20, 2022. MEDICATIONS: Ancef 2 gm IV. The antibiotic was administered within 1 hour of the procedure ANESTHESIA/SEDATION:  The procedure was performed under general anesthesia. CONTRAST:  190 mL of Omnipaque 300 milligram/mL FLUOROSCOPY: Radiation Exposure Index (as provided by the fluoroscopic device): 4,979 mGy Kerma COMPLICATIONS: None immediate. TECHNIQUE: Informed written consent was obtained from the patient after a thorough discussion of the procedural risks, benefits and alternatives. All questions were addressed. Maximal Sterile Barrier Technique was utilized including caps, mask, sterile gowns, sterile gloves, sterile drape, hand hygiene and skin antiseptic. A timeout was performed prior to the initiation of the procedure. The right groin was prepped and draped in the usual sterile fashion. Using a micropuncture kit and the modified Seldinger technique, access was gained to the right common femoral artery and an 8 French sheath was placed. Real-time ultrasound guidance was utilized for vascular access including the acquisition of a permanent ultrasound image documenting patency of the accessed vessel. Under fluoroscopy, an 8 [place] Cerebase guide catheter was navigated over a 6 [place] Berenstein 2 catheter and a 0.035" Terumo Glidewire into the aortic arch. The catheter was placed into the left common carotid artery. Frontal and lateral angiograms of the neck were obtained. Using biplane roadmap guidance, the catheter was advanced into the left internal carotid artery. The diagnostic catheter was removed. Frontal and lateral angiograms of the head were obtained. The catheter was retracted into there left common carotid artery. Under biplane roadmap guidance, the catheter was advanced into the left external carotid artery. Frontal and lateral angiograms of the head were obtained. The catheter was then selectively placed into the left occipital  artery. Frontal, lateral and multiple bilateral oblique angiograms of the head were obtained. FINDINGS: 1. Normal caliber of the right common femoral artery, adequate for vascular access.  2. Left CCA angiograms: Cervical angiograms show normal course and caliber of the visualized left common carotid and internal carotid arteries. There are no significant stenoses. 3. Left ICA angiograms: There is brisk vascular contrast filling of the left ACA and MCA vascular trees. Luminal caliber is smooth and tapering. No aneurysms or abnormally high-flow, early draining veins are seen. No regions of abnormal hypervascularity are noted. No opacification of the left transverse and sigmoid sinuses. Other dural sinuses are patent. Evidence of dilated veins along the left cerebral hemisphere, suggesting venous hypertension. 4. Left ECA and selective left occipital artery angiograms: Early venous drainage related to dural AV fistula at the left transverse-sigmoid sinus junction. The facial a supplied by the petrosal and petrosquamosal branch from the left middle meningeal artery, and branches of the left occipital artery. The fistula drains into a small patent segment of the sinus at the left transverse-sigmoid junction and into the vein of Labbe. There is cortical venous reflux and ectasia. 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 PROCEDURE: The Berenstein 2 catheter was removed. The cerebase guide catheter remaining in the left common carotid artery. Using biplane roadmap guidance, an Apro 55 distal access catheter was navigated into the left occipital artery. Frontal and lateral angiograms of the head were obtained. Then, a headway duo was navigated over a synchro 10 micro guidewire into a proximal distal left occipital artery branch. Frontal and lateral angiograms were obtained. The microcatheter was subsequently advanced into the arterial feeders. The microcatheter was then prepped with DMSO. This was followed by slow controlled infusion of onyx 34 under fluoroscopy. Control angiograms were obtained. The catheter was retracted proximally into the left occipital artery branch. Additional  embolization with onyx 34 was performed. Control angiograms were obtained. Partial occlusion of arterial feeders was noted. The mcrocatheter was removed and discarded. Follow-up frontal and lateral angiograms with left occipital artery contrast injections showed persistent occipital feeder proximal to the embolized branches. A headway duo microcatheter was then navigated to the origin of this feeder. Embolization was performed with the detachable coils. Control angiogram showed occlusion of the coiled branch. Left occipital artery angiogram showed persistent arterial feeders from a distal branch of the left occipital artery. Using biplane roadmap guidance, the catheter was advanced to the level of the feeding branch. The microcatheter was prepped with DMSO. Then, sow controlled infusion of on externalize fall was performed under fluoroscopy. Control angiograms showed no further arterial supply from the occipital artery. The microcatheter was then removed and discarded. The distal access catheter was retracted into the left external carotid artery. Frontal and lateral angiograms of the head were obtained showing prominent arterial supply from left middle meningeal artery branches. Using biplane roadmap guidance, an echelon 10 microcatheter was navigated over the wire into the petrosquamous branch of the left middle meningeal artery near fistulous point. Frontal and lateral angiograms with microcatheter injection were obtained showing adequate microcatheter position. The microcatheter was then wrapped with DMSO. Then, slow controlled infusion of onyx 34 followed by onyx 18 was performed under fluoroscopy. The microcatheter was then removed and discarded. Proximal left middle meningeal artery angiograms with frontal and lateral views of the head showed occlusion of the supply to the fistula by the petrosquamous branch with persistent prominent supply from the petrous branch. Using biplane roadmap guidance, a headway  duo microcatheter was navigated over the wire into the petrosal branch of the left middle meningeal artery near fistulous point. Frontal and lateral angiograms with microcatheter injection were obtained showing adequate microcatheter position. The microcatheter was then wrapped with DMSO. Then, slow controlled infusion of onyx 180was performed under fluoroscopy. The microcatheter was then removed and discarded. Follow-up left internal maxillary artery angiograms with frontal and lateral of the head showed complete occlusion of the left middle meningeal artery supply to the fistula. Left external carotid artery angiograms with frontal and lateral views of the head showed no opacification of the previously seen dural AV fistula. The catheter was then placed into the left internal carotid artery. Frontal and lateral angiograms of the head were obtained. No evidence of thromboembolic complication or fistula opacification. Anterograde flow in the left vein of Labbe base noted. The catheter was then placed into the left vertebral artery. Frontal and lateral angiograms of the head root obtained. No evidence of nontarget embolization or fistula opacification. The catheter was subsequently withdrawn. Flat panel CT of the head was obtained and post processed in a separate workstation with concurrent attending physician supervision. Selected images were sent to PACS. No evidence of hemorrhagic complication noted. Right common femoral artery angiogram was obtained in right anterior oblique view. The puncture is at the level of the common femoral artery. The artery has normal caliber, adequate for closure device. The sheath was exchanged over the wire for an 8 [place] Angio-Seal which was utilized for access closure. Immediate hemostasis was achieved. 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 PLAN: Follow-up angiogram in 6  months. Electronically Signed   By: Baldemar Lenis M.D.   On: 07/23/2022 12:58   IR Angiogram Follow Up Study  Result Date: 07/23/2022 INDICATION: 57 year old male presenting with fluctuating levels of altered mental status since 07/15/2022. 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 EXAM: ULTRASOUND-GUIDED VASCULAR ACCESS DIAGNOSTIC CEREBRAL ANGIOGRAM ENDOVASCULAR AV FISTULA EMBOLIZATION FLAT PANEL HEAD CT COMPARISON:  Cerebral angiogram July 20, 2022. MEDICATIONS: Ancef 2 gm IV. The antibiotic was administered within 1 hour of the procedure ANESTHESIA/SEDATION: The procedure was performed under general anesthesia. CONTRAST:  190 mL of Omnipaque 300 milligram/mL FLUOROSCOPY: Radiation Exposure Index (as provided by the fluoroscopic device): 4,979 mGy Kerma COMPLICATIONS: None immediate. TECHNIQUE: Informed written consent was obtained from the patient after a thorough discussion of the procedural risks, benefits and alternatives. All questions were addressed. Maximal Sterile Barrier Technique was utilized including caps, mask, sterile gowns, sterile gloves, sterile drape, hand hygiene and skin antiseptic. A timeout was performed prior to the initiation of the procedure. The right groin was prepped and draped in the usual sterile fashion. Using a micropuncture kit and the modified Seldinger technique, access was gained to the right common femoral artery and an 8 French sheath was placed. Real-time ultrasound guidance was utilized for vascular access including the acquisition of a  permanent ultrasound image documenting patency of the accessed vessel. Under fluoroscopy, an 8 [place] Cerebase guide catheter was navigated over a 6 [place] Berenstein 2 catheter and  a 0.035" Terumo Glidewire into the aortic arch. The catheter was placed into the left common carotid artery. Frontal and lateral angiograms of the neck were obtained. Using biplane roadmap guidance, the catheter was advanced into the left internal carotid artery. The diagnostic catheter was removed. Frontal and lateral angiograms of the head were obtained. The catheter was retracted into there left common carotid artery. Under biplane roadmap guidance, the catheter was advanced into the left external carotid artery. Frontal and lateral angiograms of the head were obtained. The catheter was then selectively placed into the left occipital artery. Frontal, lateral and multiple bilateral oblique angiograms of the head were obtained. FINDINGS: 1. Normal caliber of the right common femoral artery, adequate for vascular access. 2. Left CCA angiograms: Cervical angiograms show normal course and caliber of the visualized left common carotid and internal carotid arteries. There are no significant stenoses. 3. Left ICA angiograms: There is brisk vascular contrast filling of the left ACA and MCA vascular trees. Luminal caliber is smooth and tapering. No aneurysms or abnormally high-flow, early draining veins are seen. No regions of abnormal hypervascularity are noted. No opacification of the left transverse and sigmoid sinuses. Other dural sinuses are patent. Evidence of dilated veins along the left cerebral hemisphere, suggesting venous hypertension. 4. Left ECA and selective left occipital artery angiograms: Early venous drainage related to dural AV fistula at the left transverse-sigmoid sinus junction. The facial a supplied by the petrosal and petrosquamosal branch from the left middle meningeal artery, and branches of the left occipital artery.  The fistula drains into a small patent segment of the sinus at the left transverse-sigmoid junction and into the vein of Labbe. There is cortical venous reflux and ectasia. 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 PROCEDURE: The Berenstein 2 catheter was removed. The cerebase guide catheter remaining in the left common carotid artery. Using biplane roadmap guidance, an Apro 55 distal access catheter was navigated into the left occipital artery. Frontal and lateral angiograms of the head were obtained. Then, a headway duo was navigated over a synchro 10 micro guidewire into a proximal distal left occipital artery branch. Frontal and lateral angiograms were obtained. The microcatheter was subsequently advanced into the arterial feeders. The microcatheter was then prepped with DMSO. This was followed by slow controlled infusion of onyx 34 under fluoroscopy. Control angiograms were obtained. The catheter was retracted proximally into the left occipital artery branch. Additional embolization with onyx 34 was performed. Control angiograms were obtained. Partial occlusion of arterial feeders was noted. The mcrocatheter was removed and discarded. Follow-up frontal and lateral angiograms with left occipital artery contrast injections showed persistent occipital feeder proximal to the embolized branches. A headway duo microcatheter was then navigated to the origin of this feeder. Embolization was performed with the detachable coils. Control angiogram showed occlusion of the coiled branch. Left occipital artery angiogram showed persistent arterial feeders from a distal branch of the left occipital artery. Using biplane roadmap guidance, the catheter was advanced to the level of the feeding branch. The microcatheter was prepped with DMSO. Then, sow controlled infusion of on externalize fall was performed under fluoroscopy. Control angiograms showed no further arterial supply from the occipital artery.  The microcatheter was then removed and discarded. The distal access catheter was retracted into the left external carotid artery. Frontal and lateral angiograms of the head were obtained showing prominent arterial supply from left middle meningeal artery branches. Using biplane roadmap guidance, an echelon 10 microcatheter was navigated  over the wire into the petrosquamous branch of the left middle meningeal artery near fistulous point. Frontal and lateral angiograms with microcatheter injection were obtained showing adequate microcatheter position. The microcatheter was then wrapped with DMSO. Then, slow controlled infusion of onyx 34 followed by onyx 18 was performed under fluoroscopy. The microcatheter was then removed and discarded. Proximal left middle meningeal artery angiograms with frontal and lateral views of the head showed occlusion of the supply to the fistula by the petrosquamous branch with persistent prominent supply from the petrous branch. Using biplane roadmap guidance, a headway duo microcatheter was navigated over the wire into the petrosal branch of the left middle meningeal artery near fistulous point. Frontal and lateral angiograms with microcatheter injection were obtained showing adequate microcatheter position. The microcatheter was then wrapped with DMSO. Then, slow controlled infusion of onyx 180was performed under fluoroscopy. The microcatheter was then removed and discarded. Follow-up left internal maxillary artery angiograms with frontal and lateral of the head showed complete occlusion of the left middle meningeal artery supply to the fistula. Left external carotid artery angiograms with frontal and lateral views of the head showed no opacification of the previously seen dural AV fistula. The catheter was then placed into the left internal carotid artery. Frontal and lateral angiograms of the head were obtained. No evidence of thromboembolic complication or fistula opacification.  Anterograde flow in the left vein of Labbe base noted. The catheter was then placed into the left vertebral artery. Frontal and lateral angiograms of the head root obtained. No evidence of nontarget embolization or fistula opacification. The catheter was subsequently withdrawn. Flat panel CT of the head was obtained and post processed in a separate workstation with concurrent attending physician supervision. Selected images were sent to PACS. No evidence of hemorrhagic complication noted. Right common femoral artery angiogram was obtained in right anterior oblique view. The puncture is at the level of the common femoral artery. The artery has normal caliber, adequate for closure device. The sheath was exchanged over the wire for an 8 [place] Angio-Seal which was utilized for access closure. Immediate hemostasis was achieved. 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 PLAN: Follow-up angiogram in 6 months. Electronically Signed   By: Baldemar Lenis M.D.   On: 07/23/2022 12:58   IR Angiogram Follow Up Study  Result Date: 07/23/2022 INDICATION: 57 year old male presenting with fluctuating levels of altered mental status since 07/15/2022. 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 EXAM: ULTRASOUND-GUIDED VASCULAR ACCESS DIAGNOSTIC CEREBRAL ANGIOGRAM ENDOVASCULAR AV FISTULA EMBOLIZATION FLAT PANEL HEAD CT COMPARISON:  Cerebral angiogram July 20, 2022. MEDICATIONS: Ancef 2 gm IV. The  antibiotic was administered within 1 hour of the procedure ANESTHESIA/SEDATION: The procedure was performed under general anesthesia. CONTRAST:  190 mL of Omnipaque 300 milligram/mL FLUOROSCOPY: Radiation Exposure Index (as provided by the fluoroscopic device): 4,979 mGy Kerma COMPLICATIONS: None immediate. TECHNIQUE: Informed written consent was obtained from the patient after a thorough discussion of the procedural risks, benefits and alternatives. All questions were addressed. Maximal Sterile Barrier Technique was utilized including caps, mask, sterile  gowns, sterile gloves, sterile drape, hand hygiene and skin antiseptic. A timeout was performed prior to the initiation of the procedure. The right groin was prepped and draped in the usual sterile fashion. Using a micropuncture kit and the modified Seldinger technique, access was gained to the right common femoral artery and an 8 French sheath was placed. Real-time ultrasound guidance was utilized for vascular access including the acquisition of a permanent ultrasound image documenting patency of the accessed vessel. Under fluoroscopy, an 8 [place] Cerebase guide catheter was navigated over a 6 [place] Berenstein 2 catheter and a 0.035" Terumo Glidewire into the aortic arch. The catheter was placed into the left common carotid artery. Frontal and lateral angiograms of the neck were obtained. Using biplane roadmap guidance, the catheter was advanced into the left internal carotid artery. The diagnostic catheter was removed. Frontal and lateral angiograms of the head were obtained. The catheter was retracted into there left common carotid artery. Under biplane roadmap guidance, the catheter was advanced into the left external carotid artery. Frontal and lateral angiograms of the head were obtained. The catheter was then selectively placed into the left occipital artery. Frontal, lateral and multiple bilateral oblique angiograms of the head were obtained. FINDINGS: 1.  Normal caliber of the right common femoral artery, adequate for vascular access. 2. Left CCA angiograms: Cervical angiograms show normal course and caliber of the visualized left common carotid and internal carotid arteries. There are no significant stenoses. 3. Left ICA angiograms: There is brisk vascular contrast filling of the left ACA and MCA vascular trees. Luminal caliber is smooth and tapering. No aneurysms or abnormally high-flow, early draining veins are seen. No regions of abnormal hypervascularity are noted. No opacification of the left transverse and sigmoid sinuses. Other dural sinuses are patent. Evidence of dilated veins along the left cerebral hemisphere, suggesting venous hypertension. 4. Left ECA and selective left occipital artery angiograms: Early venous drainage related to dural AV fistula at the left transverse-sigmoid sinus junction. The facial a supplied by the petrosal and petrosquamosal branch from the left middle meningeal artery, and branches of the left occipital artery. The fistula drains into a small patent segment of the sinus at the left transverse-sigmoid junction and into the vein of Labbe. There is cortical venous reflux and ectasia. 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 PROCEDURE: The Berenstein 2 catheter was removed. The cerebase guide catheter remaining in the left common carotid artery. Using biplane roadmap guidance, an Apro 55 distal access catheter was navigated into the left occipital artery. Frontal and lateral angiograms of the head were obtained. Then, a headway duo was navigated over a synchro 10 micro guidewire into a proximal distal left occipital artery branch. Frontal and lateral angiograms were obtained. The microcatheter was subsequently advanced into the arterial feeders. The microcatheter was then prepped with DMSO. This was followed by slow controlled infusion of onyx 34 under fluoroscopy. Control angiograms were obtained. The  catheter was retracted proximally into the left occipital artery branch. Additional embolization with onyx 34 was performed. Control angiograms were obtained. Partial occlusion of arterial feeders was noted. The mcrocatheter was removed and discarded. Follow-up frontal and lateral angiograms with left occipital artery contrast injections showed persistent occipital feeder proximal to the embolized branches. A headway duo microcatheter was then navigated to the origin of this feeder. Embolization was performed with the detachable coils. Control angiogram showed occlusion of the coiled branch. Left occipital artery angiogram showed persistent arterial feeders from a distal  branch of the left occipital artery. Using biplane roadmap guidance, the catheter was advanced to the level of the feeding branch. The microcatheter was prepped with DMSO. Then, sow controlled infusion of on externalize fall was performed under fluoroscopy. Control angiograms showed no further arterial supply from the occipital artery. The microcatheter was then removed and discarded. The distal access catheter was retracted into the left external carotid artery. Frontal and lateral angiograms of the head were obtained showing prominent arterial supply from left middle meningeal artery branches. Using biplane roadmap guidance, an echelon 10 microcatheter was navigated over the wire into the petrosquamous branch of the left middle meningeal artery near fistulous point. Frontal and lateral angiograms with microcatheter injection were obtained showing adequate microcatheter position. The microcatheter was then wrapped with DMSO. Then, slow controlled infusion of onyx 34 followed by onyx 18 was performed under fluoroscopy. The microcatheter was then removed and discarded. Proximal left middle meningeal artery angiograms with frontal and lateral views of the head showed occlusion of the supply to the fistula by the petrosquamous branch with persistent  prominent supply from the petrous branch. Using biplane roadmap guidance, a headway duo microcatheter was navigated over the wire into the petrosal branch of the left middle meningeal artery near fistulous point. Frontal and lateral angiograms with microcatheter injection were obtained showing adequate microcatheter position. The microcatheter was then wrapped with DMSO. Then, slow controlled infusion of onyx 180was performed under fluoroscopy. The microcatheter was then removed and discarded. Follow-up left internal maxillary artery angiograms with frontal and lateral of the head showed complete occlusion of the left middle meningeal artery supply to the fistula. Left external carotid artery angiograms with frontal and lateral views of the head showed no opacification of the previously seen dural AV fistula. The catheter was then placed into the left internal carotid artery. Frontal and lateral angiograms of the head were obtained. No evidence of thromboembolic complication or fistula opacification. Anterograde flow in the left vein of Labbe base noted. The catheter was then placed into the left vertebral artery. Frontal and lateral angiograms of the head root obtained. No evidence of nontarget embolization or fistula opacification. The catheter was subsequently withdrawn. Flat panel CT of the head was obtained and post processed in a separate workstation with concurrent attending physician supervision. Selected images were sent to PACS. No evidence of hemorrhagic complication noted. Right common femoral artery angiogram was obtained in right anterior oblique view. The puncture is at the level of the common femoral artery. The artery has normal caliber, adequate for closure device. The sheath was exchanged over the wire for an 8 [place] Angio-Seal which was utilized for access closure. Immediate hemostasis was achieved. IMPRESSION: Successful and uncomplicated endovascular embolization of a left transverse/sigmoid  sinus dural AV fistula with complete occlusion. PLAN: Follow-up angiogram in 6 months. Electronically Signed   By: Baldemar Lenis M.D.   On: 07/23/2022 12:58   IR Angiogram Follow Up Study  Result Date: 07/23/2022 INDICATION: 58 year old male presenting with fluctuating levels of altered mental status since 07/15/2022. 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 EXAM: ULTRASOUND-GUIDED VASCULAR ACCESS DIAGNOSTIC  CEREBRAL ANGIOGRAM ENDOVASCULAR AV FISTULA EMBOLIZATION FLAT PANEL HEAD CT COMPARISON:  Cerebral angiogram July 20, 2022. MEDICATIONS: Ancef 2 gm IV. The antibiotic was administered within 1 hour of the procedure ANESTHESIA/SEDATION: The procedure was performed under general anesthesia. CONTRAST:  190 mL of Omnipaque 300 milligram/mL FLUOROSCOPY: Radiation Exposure Index (as provided by the fluoroscopic device): 4,979 mGy Kerma COMPLICATIONS: None immediate. TECHNIQUE: Informed written consent was obtained from the patient after a thorough discussion of the procedural risks, benefits and alternatives. All questions were addressed. Maximal Sterile Barrier Technique was utilized including caps, mask, sterile gowns, sterile gloves, sterile drape, hand hygiene and skin antiseptic. A timeout was performed prior to the initiation of the procedure. The right groin was prepped and draped in the usual sterile fashion. Using a micropuncture kit and the modified Seldinger technique, access was gained to the right common femoral artery and an 8 French sheath was placed. Real-time ultrasound  guidance was utilized for vascular access including the acquisition of a permanent ultrasound image documenting patency of the accessed vessel. Under fluoroscopy, an 8 [place] Cerebase guide catheter was navigated over a 6 [place] Berenstein 2 catheter and a 0.035" Terumo Glidewire into the aortic arch. The catheter was placed into the left common carotid artery. Frontal and lateral angiograms of the neck were obtained. Using biplane roadmap guidance, the catheter was advanced into the left internal carotid artery. The diagnostic catheter was removed. Frontal and lateral angiograms of the head were obtained. The catheter was retracted into there left common carotid artery. Under biplane roadmap guidance, the catheter was advanced into the left external carotid artery. Frontal and lateral angiograms of the head were obtained. The catheter was then selectively placed into the left occipital artery. Frontal, lateral and multiple bilateral oblique angiograms of the head were obtained. FINDINGS: 1. Normal caliber of the right common femoral artery, adequate for vascular access. 2. Left CCA angiograms: Cervical angiograms show normal course and caliber of the visualized left common carotid and internal carotid arteries. There are no significant stenoses. 3. Left ICA angiograms: There is brisk vascular contrast filling of the left ACA and MCA vascular trees. Luminal caliber is smooth and tapering. No aneurysms or abnormally high-flow, early draining veins are seen. No regions of abnormal hypervascularity are noted. No opacification of the left transverse and sigmoid sinuses. Other dural sinuses are patent. Evidence of dilated veins along the left cerebral hemisphere, suggesting venous hypertension. 4. Left ECA and selective left occipital artery angiograms: Early venous drainage related to dural AV fistula at the left transverse-sigmoid sinus junction. The facial a supplied by the petrosal and petrosquamosal branch from the  left middle meningeal artery, and branches of the left occipital artery. The fistula drains into a small patent segment of the sinus at the left transverse-sigmoid junction and into the vein of Labbe. There is cortical venous reflux and ectasia. 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 PROCEDURE: The Berenstein 2 catheter was removed. The cerebase guide catheter remaining in the left common carotid artery. Using biplane roadmap guidance, an Apro 55 distal access catheter was navigated into the left occipital artery. Frontal and lateral angiograms of the head were obtained. Then, a headway duo was navigated over a synchro 10 micro guidewire into a proximal distal left occipital artery branch. Frontal and lateral angiograms were obtained. The microcatheter was subsequently advanced into the arterial feeders. The microcatheter was then prepped with DMSO. This was followed by slow controlled infusion of onyx 34 under fluoroscopy.  Control angiograms were obtained. The catheter was retracted proximally into the left occipital artery branch. Additional embolization with onyx 34 was performed. Control angiograms were obtained. Partial occlusion of arterial feeders was noted. The mcrocatheter was removed and discarded. Follow-up frontal and lateral angiograms with left occipital artery contrast injections showed persistent occipital feeder proximal to the embolized branches. A headway duo microcatheter was then navigated to the origin of this feeder. Embolization was performed with the detachable coils. Control angiogram showed occlusion of the coiled branch. Left occipital artery angiogram showed persistent arterial feeders from a distal branch of the left occipital artery. Using biplane roadmap guidance, the catheter was advanced to the level of the feeding branch. The microcatheter was prepped with DMSO. Then, sow controlled infusion of on externalize fall was performed under fluoroscopy. Control  angiograms showed no further arterial supply from the occipital artery. The microcatheter was then removed and discarded. The distal access catheter was retracted into the left external carotid artery. Frontal and lateral angiograms of the head were obtained showing prominent arterial supply from left middle meningeal artery branches. Using biplane roadmap guidance, an echelon 10 microcatheter was navigated over the wire into the petrosquamous branch of the left middle meningeal artery near fistulous point. Frontal and lateral angiograms with microcatheter injection were obtained showing adequate microcatheter position. The microcatheter was then wrapped with DMSO. Then, slow controlled infusion of onyx 34 followed by onyx 18 was performed under fluoroscopy. The microcatheter was then removed and discarded. Proximal left middle meningeal artery angiograms with frontal and lateral views of the head showed occlusion of the supply to the fistula by the petrosquamous branch with persistent prominent supply from the petrous branch. Using biplane roadmap guidance, a headway duo microcatheter was navigated over the wire into the petrosal branch of the left middle meningeal artery near fistulous point. Frontal and lateral angiograms with microcatheter injection were obtained showing adequate microcatheter position. The microcatheter was then wrapped with DMSO. Then, slow controlled infusion of onyx 180was performed under fluoroscopy. The microcatheter was then removed and discarded. Follow-up left internal maxillary artery angiograms with frontal and lateral of the head showed complete occlusion of the left middle meningeal artery supply to the fistula. Left external carotid artery angiograms with frontal and lateral views of the head showed no opacification of the previously seen dural AV fistula. The catheter was then placed into the left internal carotid artery. Frontal and lateral angiograms of the head were obtained.  No evidence of thromboembolic complication or fistula opacification. Anterograde flow in the left vein of Labbe base noted. The catheter was then placed into the left vertebral artery. Frontal and lateral angiograms of the head root obtained. No evidence of nontarget embolization or fistula opacification. The catheter was subsequently withdrawn. Flat panel CT of the head was obtained and post processed in a separate workstation with concurrent attending physician supervision. Selected images were sent to PACS. No evidence of hemorrhagic complication noted. Right common femoral artery angiogram was obtained in right anterior oblique view. The puncture is at the level of the common femoral artery. The artery has normal caliber, adequate for closure device. The sheath was exchanged over the wire for an 8 [place] Angio-Seal which was utilized for access closure. Immediate hemostasis was achieved. 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 PLAN: Follow-up angiogram in 6 months. Electronically Signed   By: Baldemar Lenis M.D.   On: 07/23/2022 12:58   IR  ANGIO VERTEBRAL SEL VERTEBRAL UNI L MOD SED  Result Date: 07/23/2022 INDICATION: 57 year old male presenting with fluctuating levels of altered mental status since 07/15/2022. 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 EXAM: ULTRASOUND-GUIDED VASCULAR ACCESS DIAGNOSTIC CEREBRAL ANGIOGRAM ENDOVASCULAR AV FISTULA EMBOLIZATION FLAT PANEL HEAD CT  COMPARISON:  Cerebral angiogram July 20, 2022. MEDICATIONS: Ancef 2 gm IV. The antibiotic was administered within 1 hour of the procedure ANESTHESIA/SEDATION: The procedure was performed under general anesthesia. CONTRAST:  190 mL of Omnipaque 300 milligram/mL FLUOROSCOPY: Radiation Exposure Index (as provided by the fluoroscopic device): 4,979 mGy Kerma COMPLICATIONS: None immediate. TECHNIQUE: Informed written consent was obtained from the patient after a thorough discussion of the procedural risks, benefits and alternatives. All questions were addressed. Maximal Sterile Barrier Technique was utilized including caps, mask, sterile gowns, sterile gloves, sterile drape, hand hygiene and skin antiseptic. A timeout was performed prior to the initiation of the procedure. The right groin was prepped and draped in the usual sterile fashion. Using a micropuncture kit and the modified Seldinger technique, access was gained to the right common femoral artery and an 8 French sheath was placed. Real-time ultrasound guidance was utilized for vascular access including the acquisition of a permanent ultrasound image documenting patency of the accessed vessel. Under fluoroscopy, an 8 [place] Cerebase guide catheter was navigated over a 6 [place] Berenstein 2 catheter and a 0.035" Terumo Glidewire into the aortic arch. The catheter was placed into the left common carotid artery. Frontal and lateral angiograms of the neck were obtained. Using biplane roadmap guidance, the catheter was advanced into the left internal carotid artery. The diagnostic catheter was removed. Frontal and lateral angiograms of the head were obtained. The catheter was retracted into there left common carotid artery. Under biplane roadmap guidance, the catheter was advanced into the left external carotid artery. Frontal and lateral angiograms of the head were obtained. The catheter was then selectively placed into the left occipital artery. Frontal, lateral  and multiple bilateral oblique angiograms of the head were obtained. FINDINGS: 1. Normal caliber of the right common femoral artery, adequate for vascular access. 2. Left CCA angiograms: Cervical angiograms show normal course and caliber of the visualized left common carotid and internal carotid arteries. There are no significant stenoses. 3. Left ICA angiograms: There is brisk vascular contrast filling of the left ACA and MCA vascular trees. Luminal caliber is smooth and tapering. No aneurysms or abnormally high-flow, early draining veins are seen. No regions of abnormal hypervascularity are noted. No opacification of the left transverse and sigmoid sinuses. Other dural sinuses are patent. Evidence of dilated veins along the left cerebral hemisphere, suggesting venous hypertension. 4. Left ECA and selective left occipital artery angiograms: Early venous drainage related to dural AV fistula at the left transverse-sigmoid sinus junction. The facial a supplied by the petrosal and petrosquamosal branch from the left middle meningeal artery, and branches of the left occipital artery. The fistula drains into a small patent segment of the sinus at the left transverse-sigmoid junction and into the vein of Labbe. There is cortical venous reflux and ectasia. 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 PROCEDURE: The Berenstein 2 catheter was removed. The cerebase guide catheter remaining in the left common carotid artery. Using biplane roadmap guidance, an Apro 55 distal access catheter was navigated into the left occipital artery. Frontal and lateral angiograms of the head were obtained. Then, a headway duo was navigated over a synchro 10 micro guidewire into a proximal distal left occipital artery branch. Frontal and lateral angiograms were obtained. The microcatheter was subsequently advanced into the arterial feeders. The microcatheter was then prepped with DMSO. This was followed by slow controlled  infusion of onyx 34 under fluoroscopy. Control angiograms were obtained. The catheter was retracted proximally into the left occipital artery branch. Additional embolization with onyx 34 was performed. Control angiograms were obtained. Partial occlusion of arterial feeders was noted. The mcrocatheter was removed and discarded. Follow-up frontal and lateral angiograms with left occipital artery contrast injections showed persistent occipital feeder proximal to the embolized branches. A headway duo microcatheter was then navigated to the origin of this feeder. Embolization was performed with the detachable coils. Control angiogram showed occlusion of the coiled branch. Left occipital artery angiogram showed persistent arterial feeders from a distal branch of the left occipital artery. Using biplane roadmap guidance, the catheter was advanced to the level of the feeding branch. The microcatheter was prepped with DMSO. Then, sow controlled infusion of on externalize fall was performed under fluoroscopy. Control angiograms showed no further arterial supply from the occipital artery. The microcatheter was then removed and discarded. The distal access catheter was retracted into the left external carotid artery. Frontal and lateral angiograms of the head were obtained showing prominent arterial supply from left middle meningeal artery branches. Using biplane roadmap guidance, an echelon 10 microcatheter was navigated over the wire into the petrosquamous branch of the left middle meningeal artery near fistulous point. Frontal and lateral angiograms with microcatheter injection were obtained showing adequate microcatheter position. The microcatheter was then wrapped with DMSO. Then, slow controlled infusion of onyx 34 followed by onyx 18 was performed under fluoroscopy. The microcatheter was then removed and discarded. Proximal left middle meningeal artery angiograms with frontal and lateral views of the head showed  occlusion of the supply to the fistula by the petrosquamous branch with persistent prominent supply from the petrous branch. Using biplane roadmap guidance, a headway duo microcatheter was navigated over the wire into the petrosal branch of the left middle meningeal artery near fistulous point. Frontal and lateral angiograms with microcatheter injection were obtained showing adequate microcatheter position. The microcatheter was then wrapped with DMSO. Then, slow controlled infusion of onyx 180was performed under fluoroscopy. The microcatheter was then removed and discarded. Follow-up left internal maxillary artery angiograms with frontal and lateral of the head showed complete occlusion of the left middle meningeal artery supply to the fistula. Left external carotid artery angiograms with frontal and lateral views of the head showed no opacification of the previously seen dural AV fistula. The catheter was then placed into the left internal carotid artery. Frontal and lateral angiograms of the head were obtained. No evidence of thromboembolic complication or fistula opacification. Anterograde flow in the left vein of Labbe base noted. The catheter was then placed into the left vertebral artery. Frontal and lateral angiograms of the head root obtained. No evidence of nontarget embolization or fistula opacification. The catheter was subsequently withdrawn. Flat panel CT of the head was obtained and post processed in a separate workstation with concurrent attending physician supervision. Selected images were sent to PACS.  No evidence of hemorrhagic complication noted. Right common femoral artery angiogram was obtained in right anterior oblique view. The puncture is at the level of the common femoral artery. The artery has normal caliber, adequate for closure device. The sheath was exchanged over the wire for an 8 [place] Angio-Seal which was utilized for access closure. Immediate hemostasis was achieved. 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 PLAN: Follow-up angiogram in 6 months. Electronically Signed   By: Baldemar Lenis M.D.   On: 07/23/2022 12:58   IR ANGIO INTRA EXTRACRAN SEL INTERNAL CAROTID UNI L MOD SED  Result Date: 07/23/2022 INDICATION: 57 year old male presenting with fluctuating levels of altered mental status since 07/15/2022. 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 EXAM: ULTRASOUND-GUIDED VASCULAR ACCESS DIAGNOSTIC CEREBRAL ANGIOGRAM ENDOVASCULAR AV FISTULA EMBOLIZATION FLAT PANEL HEAD CT COMPARISON:  Cerebral angiogram July 20, 2022. MEDICATIONS: Ancef 2 gm IV. The antibiotic was administered within 1 hour of the procedure ANESTHESIA/SEDATION: The procedure was performed under general anesthesia. CONTRAST:  190 mL of Omnipaque 300 milligram/mL FLUOROSCOPY: Radiation Exposure Index (as provided by the fluoroscopic device): 4,979 mGy Kerma COMPLICATIONS: None immediate. TECHNIQUE: Informed written consent was obtained from the patient after a thorough discussion of the procedural risks, benefits and alternatives. All questions were addressed. Maximal Sterile Barrier Technique was utilized including caps, mask, sterile gowns, sterile gloves, sterile drape, hand hygiene and skin antiseptic. A timeout was performed prior to the initiation of the procedure. The right groin was prepped and draped in the usual sterile fashion. Using a micropuncture kit and the modified Seldinger  technique, access was gained to the right common femoral artery and an 8 French sheath was placed. Real-time ultrasound guidance was utilized for vascular access including the acquisition of a permanent ultrasound image documenting patency of the accessed vessel. Under fluoroscopy, an 8 [place] Cerebase guide catheter was navigated over a 6 [place] Berenstein 2 catheter and a 0.035" Terumo Glidewire into the aortic arch. The catheter was placed into the left common carotid artery. Frontal and lateral angiograms of the neck were obtained. Using biplane roadmap guidance, the catheter was advanced into the left internal carotid artery. The diagnostic catheter was removed. Frontal and lateral angiograms of the head were obtained. The catheter was retracted into there left common carotid artery. Under biplane roadmap guidance, the catheter was advanced into the left external carotid artery. Frontal and lateral angiograms of the head were obtained. The catheter was then selectively placed into the left occipital artery. Frontal, lateral and multiple bilateral oblique angiograms of the head were obtained. FINDINGS: 1. Normal caliber of the right common femoral artery, adequate for vascular access. 2. Left CCA angiograms: Cervical angiograms show normal course and caliber of the visualized left common carotid and internal carotid arteries. There are no significant stenoses. 3. Left ICA angiograms: There is brisk vascular contrast filling of the left ACA and MCA vascular trees. Luminal caliber is smooth and tapering. No aneurysms or abnormally high-flow, early draining veins are seen. No regions of abnormal hypervascularity are noted. No opacification of the  left transverse and sigmoid sinuses. Other dural sinuses are patent. Evidence of dilated veins along the left cerebral hemisphere, suggesting venous hypertension. 4. Left ECA and selective left occipital artery angiograms: Early venous drainage related to dural AV fistula  at the left transverse-sigmoid sinus junction. The facial a supplied by the petrosal and petrosquamosal branch from the left middle meningeal artery, and branches of the left occipital artery. The fistula drains into a small patent segment of the sinus at the left transverse-sigmoid junction and into the vein of Labbe. There is cortical venous reflux and ectasia. 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 PROCEDURE: The Berenstein 2 catheter was removed. The cerebase guide catheter remaining in the left common carotid artery. Using biplane roadmap guidance, an Apro 55 distal access catheter was navigated into the left occipital artery. Frontal and lateral angiograms of the head were obtained. Then, a headway duo was navigated over a synchro 10 micro guidewire into a proximal distal left occipital artery branch. Frontal and lateral angiograms were obtained. The microcatheter was subsequently advanced into the arterial feeders. The microcatheter was then prepped with DMSO. This was followed by slow controlled infusion of onyx 34 under fluoroscopy. Control angiograms were obtained. The catheter was retracted proximally into the left occipital artery branch. Additional embolization with onyx 34 was performed. Control angiograms were obtained. Partial occlusion of arterial feeders was noted. The mcrocatheter was removed and discarded. Follow-up frontal and lateral angiograms with left occipital artery contrast injections showed persistent occipital feeder proximal to the embolized branches. A headway duo microcatheter was then navigated to the origin of this feeder. Embolization was performed with the detachable coils. Control angiogram showed occlusion of the coiled branch. Left occipital artery angiogram showed persistent arterial feeders from a distal branch of the left occipital artery. Using biplane roadmap guidance, the catheter was advanced to the level of the feeding branch. The  microcatheter was prepped with DMSO. Then, sow controlled infusion of on externalize fall was performed under fluoroscopy. Control angiograms showed no further arterial supply from the occipital artery. The microcatheter was then removed and discarded. The distal access catheter was retracted into the left external carotid artery. Frontal and lateral angiograms of the head were obtained showing prominent arterial supply from left middle meningeal artery branches. Using biplane roadmap guidance, an echelon 10 microcatheter was navigated over the wire into the petrosquamous branch of the left middle meningeal artery near fistulous point. Frontal and lateral angiograms with microcatheter injection were obtained showing adequate microcatheter position. The microcatheter was then wrapped with DMSO. Then, slow controlled infusion of onyx 34 followed by onyx 18 was performed under fluoroscopy. The microcatheter was then removed and discarded. Proximal left middle meningeal artery angiograms with frontal and lateral views of the head showed occlusion of the supply to the fistula by the petrosquamous branch with persistent prominent supply from the petrous branch. Using biplane roadmap guidance, a headway duo microcatheter was navigated over the wire into the petrosal branch of the left middle meningeal artery near fistulous point. Frontal and lateral angiograms with microcatheter injection were obtained showing adequate microcatheter position. The microcatheter was then wrapped with DMSO. Then, slow controlled infusion of onyx 180was performed under fluoroscopy. The microcatheter was then removed and discarded. Follow-up left internal maxillary artery angiograms with frontal and lateral of the head showed complete occlusion of the left middle meningeal artery supply to the fistula. Left external carotid artery angiograms with frontal and lateral views of the head showed no  opacification of the previously seen dural AV  fistula. The catheter was then placed into the left internal carotid artery. Frontal and lateral angiograms of the head were obtained. No evidence of thromboembolic complication or fistula opacification. Anterograde flow in the left vein of Labbe base noted. The catheter was then placed into the left vertebral artery. Frontal and lateral angiograms of the head root obtained. No evidence of nontarget embolization or fistula opacification. The catheter was subsequently withdrawn. Flat panel CT of the head was obtained and post processed in a separate workstation with concurrent attending physician supervision. Selected images were sent to PACS. No evidence of hemorrhagic complication noted. Right common femoral artery angiogram was obtained in right anterior oblique view. The puncture is at the level of the common femoral artery. The artery has normal caliber, adequate for closure device. The sheath was exchanged over the wire for an 8 [place] Angio-Seal which was utilized for access closure. Immediate hemostasis was achieved. 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 PLAN: Follow-up angiogram in 6 months. Electronically Signed   By: Baldemar Lenis M.D.   On: 07/23/2022 12:58   IR US Guide Vasc Access Right  Result Date: 07/20/2022 INDICATION: 57 year old male presenting with fluctuating levels of altered mental status since 07/15/2022. 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 He comes to our service today to evaluate cross-sectional imaging findings. EXAM: ULTRASOUND-GUIDED VASCULAR ACCESS DIAGNOSTIC CEREBRAL ANGIOGRAM COMPARISON:  CTA head and neck July 17, 2022; MRA July 16, 2022. MEDICATIONS: No antibiotic was administered. ANESTHESIA/SEDATION: Moderate (conscious) sedation was employed during this  procedure. A total of Versed 1.5 mg and Fentanyl 20 mcg was administered intravenously by the radiology nurse. Total intra-service moderate Sedation Time: 54 minutes. The patient's level of consciousness and vital signs were monitored continuously by radiology nursing throughout the procedure under my direct supervision. CONTRAST:  108 mL of Omnipaque 300 milligram/mL FLUOROSCOPY: Radiation Exposure Index (as provided by the fluoroscopic device): Peak skin dose 1,277 mGy Kerma COMPLICATIONS: None immediate. TECHNIQUE: Informed written consent was obtained from the patient after a thorough discussion of the procedural risks, benefits and alternatives. All questions were addressed. Maximal Sterile Barrier Technique was utilized including caps, mask, sterile gowns, sterile gloves, sterile drape, hand hygiene and skin antiseptic. A timeout was performed prior to the initiation of the procedure. Using the modified Seldinger technique and a micropuncture kit, access was gained to the distal right radial artery at the anatomical snuffbox and a 5 French sheath was placed. Real-time ultrasound guidance was utilized for vascular access including the acquisition of a permanent ultrasound image documenting patency of the accessed vessel. Slow intra arterial infusion of 5,000 IU heparin, 5 mg Verapamil and 200 mcg nitroglycerin diluted in patient's own blood was performed. No significant fluctuation in patient's blood pressure seen. Then, a right radial artery angiogram was obtained via sheath side port. Normal brachial artery branching pattern seen. No significant anatomical variation. The right radial artery caliber is adequate for vascular access. Next, a 5 [place] Simmons 2 glide catheter was navigated over a 0.035" Terumo Glidewire into the right subclavian artery under fluoroscopic guidance and subsequently into the right common carotid artery. Frontal and lateral angiograms of the neck were obtained. Using biplane roadmap  guidance, the catheter was placed into the right internal carotid artery. Frontal and lateral angiograms of the head were obtained.  The catheter was retracted into the right common carotid artery. Using biplane roadmap guidance, the catheter was placed into the right external carotid artery. Frontal and lateral angiograms of the head were obtained. Next, the catheter was placed into the left subclavian artery. Using roadmap guidance, the catheter was advanced into the left vertebral artery. Frontal and lateral angiograms of the head were obtained. Subsequently, the catheter was placed into the left common carotid artery. Frontal and lateral angiograms of the neck were obtained. Using biplane roadmap guidance, the catheter was placed into the left internal carotid artery. Frontal and lateral angiograms of the head were obtained. The catheter was then retracted into the left common carotid artery. Using biplane roadmap guidance, the catheter was advanced into the left external carotid artery. Frontal and lateral angiograms of the head were obtained. Then, the catheter was placed into the left occipital artery. Angiograms with frontal, lateral, magnified left anterior oblique and magnified shoulders view of the head were obtained. The catheter was subsequently withdrawn. An inflatable band was placed and inflated over the right hand access site. The vascular sheath was withdrawn and the band was slowly deflated until brisk flow was noted through the arteriotomy site. At this point, the band was reinflated with additional 2 cc of air to obtain patent hemostasis. FINDINGS: Right radial artery ultrasound and right radial artery angiogram: The caliber of the distal right radial artery is appropriate for angiogram access. The right radial artery and the right ulnar artery have normal course and caliber. No significant anatomical variants noted. Right CCA angiograms: Cervical angiograms show normal course and caliber of the  visualized right common carotid and internal carotid arteries. There are no significant stenoses. Right ICA angiograms: There is brisk vascular contrast filling of the right ACA and MCA vascular trees. Luminal caliber is smooth and tapering. No aneurysms or abnormally high-flow, early draining veins are seen. No regions of abnormal hypervascularity are noted. No opacification of the left transverse and sigmoid sinuses. Other dural sinuses are patent. Right ECA angiograms: No early venous drainage was noted. The visualized branches of the right external carotid artery are unremarkable. Left vertebral artery angiograms: The left vertebral artery, basilar artery, and bilateral posterior cerebral arteries are unremarkable. Luminal caliber is smooth and tapering. No aneurysms or abnormally high-flow, early draining veins are seen. No regions of abnormal hypervascularity are noted. No opacification of the left transverse and sigmoid sinuses. Other dural sinuses are patent. Dilated left occipital and cerebellar veins are noted, suggesting venous hypertension. Left CCA angiograms: Cervical angiograms show normal course and caliber of the visualized left common carotid and internal carotid arteries. There are no significant stenoses. Left ICA angiograms: There is brisk vascular contrast filling of the left ACA and MCA vascular trees. Luminal caliber is smooth and tapering. No aneurysms or abnormally high-flow, early draining veins are seen. No regions of abnormal hypervascularity are noted. No opacification of the left transverse and sigmoid sinuses. Other dural sinuses are patent. Evidence of dilated veins along the left cerebral hemisphere, suggesting venous hypertension. Left ECA and selective left occipital artery angiograms: Early venous drainage related to dural AV fistula at the left transverse-sigmoid sinus junction. The facial a supplied by the petrosal and petrosquamosal branch from the left middle meningeal  artery, and branches of the left occipital artery. The facial drains into a small patent segment of the sinus at the left transverse-sigmoid junction and into the vein of Labbe. There is cortical venous reflux and ectasias. 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 PROCEDURE: No intervention performed. 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 PLAN: Findings discussed with the patient and Dr. Pearlean Brownie. Fistula embolization planned for July 21, 2020. Electronically Signed   By: Baldemar Lenis M.D.   On: 07/20/2022 14:37   IR ANGIO INTRA EXTRACRAN SEL INTERNAL CAROTID BILAT MOD SED  Result Date: 07/20/2022 INDICATION: 57 year old male presenting with fluctuating levels of altered mental status since 07/15/2022. 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 He comes to our service today to evaluate cross-sectional imaging findings. EXAM: ULTRASOUND-GUIDED VASCULAR ACCESS DIAGNOSTIC CEREBRAL ANGIOGRAM COMPARISON:  CTA head and neck July 17, 2022; MRA July 16, 2022. MEDICATIONS: No antibiotic was administered. ANESTHESIA/SEDATION: Moderate (conscious) sedation was employed during this procedure. A total of Versed 1.5 mg and Fentanyl 20 mcg was administered intravenously by the radiology nurse. Total intra-service moderate Sedation Time: 54 minutes. The patient's level of consciousness and vital signs were monitored continuously by radiology nursing throughout the procedure under my direct supervision. CONTRAST:  108 mL of Omnipaque 300 milligram/mL FLUOROSCOPY: Radiation Exposure Index (as provided by the fluoroscopic device): Peak skin dose 1,277 mGy Kerma COMPLICATIONS: None immediate. TECHNIQUE: Informed  written consent was obtained from the patient after a thorough discussion of the procedural risks, benefits and alternatives. All questions were addressed. Maximal Sterile Barrier Technique was utilized including caps, mask, sterile gowns, sterile gloves, sterile drape, hand hygiene and skin antiseptic. A timeout was performed prior to the initiation of the procedure. Using the modified Seldinger technique and a micropuncture kit, access was gained to the distal right radial artery at the anatomical snuffbox and a 5 French sheath was placed. Real-time ultrasound guidance was utilized for vascular access including the acquisition of a permanent ultrasound image documenting patency of the accessed vessel. Slow intra arterial infusion of 5,000 IU heparin, 5 mg Verapamil and 200 mcg nitroglycerin diluted in patient's own blood was performed. No significant fluctuation in patient's blood pressure seen. Then, a right radial artery angiogram was obtained via sheath side port. Normal brachial artery branching pattern seen. No significant anatomical variation. The right radial artery caliber is adequate for vascular access. Next, a 5 [place] Simmons 2 glide catheter was navigated over a 0.035" Terumo Glidewire into the right subclavian artery under fluoroscopic guidance and subsequently into the right common carotid artery. Frontal and lateral angiograms of the neck were obtained. Using biplane roadmap guidance, the catheter was placed into the right internal carotid artery. Frontal and lateral angiograms of the head were obtained. The catheter was retracted into the right common carotid artery. Using biplane roadmap guidance, the catheter was placed into the right external carotid artery. Frontal and lateral angiograms of the head were obtained. Next, the catheter was placed into the left subclavian artery. Using roadmap guidance, the catheter was advanced into the left vertebral artery. Frontal and lateral angiograms of the  head were obtained. Subsequently, the catheter was placed into the left common carotid artery. Frontal and lateral angiograms of the neck were obtained. Using biplane roadmap guidance, the catheter was placed into the left internal carotid artery. Frontal and lateral angiograms of the head were obtained. The catheter was then retracted into the left common carotid artery. Using biplane  roadmap guidance, the catheter was advanced into the left external carotid artery. Frontal and lateral angiograms of the head were obtained. Then, the catheter was placed into the left occipital artery. Angiograms with frontal, lateral, magnified left anterior oblique and magnified shoulders view of the head were obtained. The catheter was subsequently withdrawn. An inflatable band was placed and inflated over the right hand access site. The vascular sheath was withdrawn and the band was slowly deflated until brisk flow was noted through the arteriotomy site. At this point, the band was reinflated with additional 2 cc of air to obtain patent hemostasis. FINDINGS: Right radial artery ultrasound and right radial artery angiogram: The caliber of the distal right radial artery is appropriate for angiogram access. The right radial artery and the right ulnar artery have normal course and caliber. No significant anatomical variants noted. Right CCA angiograms: Cervical angiograms show normal course and caliber of the visualized right common carotid and internal carotid arteries. There are no significant stenoses. Right ICA angiograms: There is brisk vascular contrast filling of the right ACA and MCA vascular trees. Luminal caliber is smooth and tapering. No aneurysms or abnormally high-flow, early draining veins are seen. No regions of abnormal hypervascularity are noted. No opacification of the left transverse and sigmoid sinuses. Other dural sinuses are patent. Right ECA angiograms: No early venous drainage was noted. The visualized  branches of the right external carotid artery are unremarkable. Left vertebral artery angiograms: The left vertebral artery, basilar artery, and bilateral posterior cerebral arteries are unremarkable. Luminal caliber is smooth and tapering. No aneurysms or abnormally high-flow, early draining veins are seen. No regions of abnormal hypervascularity are noted. No opacification of the left transverse and sigmoid sinuses. Other dural sinuses are patent. Dilated left occipital and cerebellar veins are noted, suggesting venous hypertension. Left CCA angiograms: Cervical angiograms show normal course and caliber of the visualized left common carotid and internal carotid arteries. There are no significant stenoses. Left ICA angiograms: There is brisk vascular contrast filling of the left ACA and MCA vascular trees. Luminal caliber is smooth and tapering. No aneurysms or abnormally high-flow, early draining veins are seen. No regions of abnormal hypervascularity are noted. No opacification of the left transverse and sigmoid sinuses. Other dural sinuses are patent. Evidence of dilated veins along the left cerebral hemisphere, suggesting venous hypertension. Left ECA and selective left occipital artery angiograms: Early venous drainage related to dural AV fistula at the left transverse-sigmoid sinus junction. The facial a supplied by the petrosal and petrosquamosal branch from the left middle meningeal artery, and branches of the left occipital artery. The facial drains into a small patent segment of the sinus at the left transverse-sigmoid junction and into the vein of Labbe. There is cortical venous reflux and ectasias. 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 PROCEDURE: No intervention performed. 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 PLAN: Findings discussed with the patient and Dr. Pearlean Brownie. Fistula embolization planned for July 21, 2020. Electronically Signed   By: Baldemar Lenis M.D.   On: 07/20/2022 14:37   IR ANGIO EXTERNAL CAROTID SEL EXT CAROTID BILAT MOD SED  Result Date: 07/20/2022 INDICATION: 57 year old male presenting with fluctuating levels of altered mental status since 07/15/2022. Head CT and MRI showed thrombosis of the left transverse/sigmoid sinus  extending to the jugular vein with associated dural AV fistula. He comes to our service today to evaluate cross-sectional imaging findings. EXAM: ULTRASOUND-GUIDED VASCULAR ACCESS DIAGNOSTIC CEREBRAL ANGIOGRAM COMPARISON:  CTA head and neck July 17, 2022; MRA July 16, 2022. MEDICATIONS: No antibiotic was administered. ANESTHESIA/SEDATION: Moderate (conscious) sedation was employed during this procedure. A total of Versed 1.5 mg and Fentanyl 20 mcg was administered intravenously by the radiology nurse. Total intra-service moderate Sedation Time: 54 minutes. The patient's level of consciousness and vital signs were monitored continuously by radiology nursing throughout the procedure under my direct supervision. CONTRAST:  108 mL of Omnipaque 300 milligram/mL FLUOROSCOPY: Radiation Exposure Index (as provided by the fluoroscopic device): Peak skin dose 1,277 mGy Kerma COMPLICATIONS: None immediate. TECHNIQUE: Informed written consent was obtained from the patient after a thorough discussion of the procedural risks, benefits and alternatives. All questions were addressed. Maximal Sterile Barrier Technique was utilized including caps, mask, sterile gowns, sterile gloves, sterile drape, hand hygiene and skin antiseptic. A timeout was performed prior to the initiation of the procedure. Using the modified Seldinger technique and a micropuncture kit, access was gained to the distal right radial artery at the anatomical snuffbox and a 5 French  sheath was placed. Real-time ultrasound guidance was utilized for vascular access including the acquisition of a permanent ultrasound image documenting patency of the accessed vessel. Slow intra arterial infusion of 5,000 IU heparin, 5 mg Verapamil and 200 mcg nitroglycerin diluted in patient's own blood was performed. No significant fluctuation in patient's blood pressure seen. Then, a right radial artery angiogram was obtained via sheath side port. Normal brachial artery branching pattern seen. No significant anatomical variation. The right radial artery caliber is adequate for vascular access. Next, a 5 [place] Simmons 2 glide catheter was navigated over a 0.035" Terumo Glidewire into the right subclavian artery under fluoroscopic guidance and subsequently into the right common carotid artery. Frontal and lateral angiograms of the neck were obtained. Using biplane roadmap guidance, the catheter was placed into the right internal carotid artery. Frontal and lateral angiograms of the head were obtained. The catheter was retracted into the right common carotid artery. Using biplane roadmap guidance, the catheter was placed into the right external carotid artery. Frontal and lateral angiograms of the head were obtained. Next, the catheter was placed into the left subclavian artery. Using roadmap guidance, the catheter was advanced into the left vertebral artery. Frontal and lateral angiograms of the head were obtained. Subsequently, the catheter was placed into the left common carotid artery. Frontal and lateral angiograms of the neck were obtained. Using biplane roadmap guidance, the catheter was placed into the left internal carotid artery. Frontal and lateral angiograms of the head were obtained. The catheter was then retracted into the left common carotid artery. Using biplane roadmap guidance, the catheter was advanced into the left external carotid artery. Frontal and lateral angiograms of the head were  obtained. Then, the catheter was placed into the left occipital artery. Angiograms with frontal, lateral, magnified left anterior oblique and magnified shoulders view of the head were obtained. The catheter was subsequently withdrawn. An inflatable band was placed and inflated over the right hand access site. The vascular sheath was withdrawn and the band was slowly deflated until brisk flow was noted through the arteriotomy site. At this point, the band was reinflated with additional 2 cc of air to obtain patent hemostasis. FINDINGS: Right radial artery ultrasound and right radial artery angiogram: The caliber of the distal right radial  artery is appropriate for angiogram access. The right radial artery and the right ulnar artery have normal course and caliber. No significant anatomical variants noted. Right CCA angiograms: Cervical angiograms show normal course and caliber of the visualized right common carotid and internal carotid arteries. There are no significant stenoses. Right ICA angiograms: There is brisk vascular contrast filling of the right ACA and MCA vascular trees. Luminal caliber is smooth and tapering. No aneurysms or abnormally high-flow, early draining veins are seen. No regions of abnormal hypervascularity are noted. No opacification of the left transverse and sigmoid sinuses. Other dural sinuses are patent. Right ECA angiograms: No early venous drainage was noted. The visualized branches of the right external carotid artery are unremarkable. Left vertebral artery angiograms: The left vertebral artery, basilar artery, and bilateral posterior cerebral arteries are unremarkable. Luminal caliber is smooth and tapering. No aneurysms or abnormally high-flow, early draining veins are seen. No regions of abnormal hypervascularity are noted. No opacification of the left transverse and sigmoid sinuses. Other dural sinuses are patent. Dilated left occipital and cerebellar veins are noted, suggesting venous  hypertension. Left CCA angiograms: Cervical angiograms show normal course and caliber of the visualized left common carotid and internal carotid arteries. There are no significant stenoses. Left ICA angiograms: There is brisk vascular contrast filling of the left ACA and MCA vascular trees. Luminal caliber is smooth and tapering. No aneurysms or abnormally high-flow, early draining veins are seen. No regions of abnormal hypervascularity are noted. No opacification of the left transverse and sigmoid sinuses. Other dural sinuses are patent. Evidence of dilated veins along the left cerebral hemisphere, suggesting venous hypertension. Left ECA and selective left occipital artery angiograms: Early venous drainage related to dural AV fistula at the left transverse-sigmoid sinus junction. The facial a supplied by the petrosal and petrosquamosal branch from the left middle meningeal artery, and branches of the left occipital artery. The facial drains into a small patent segment of the sinus at the left transverse-sigmoid junction and into the vein of Labbe. There is cortical venous reflux and ectasias. 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 PROCEDURE: No intervention performed. 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 PLAN: Findings discussed with the patient and Dr. Pearlean Brownie. Fistula embolization planned for July 21, 2020. Electronically Signed   By: Baldemar Lenis M.D.   On: 07/20/2022 14:37   IR ANGIO VERTEBRAL SEL VERTEBRAL UNI L MOD SED  Result Date: 07/20/2022 INDICATION: 57 year old male presenting with fluctuating levels of altered mental status since 07/15/2022. Head CT and MRI showed thrombosis of the left transverse/sigmoid sinus extending to the jugular vein with associated dural AV  fistula. He comes to our service today to evaluate cross-sectional imaging findings. EXAM: ULTRASOUND-GUIDED VASCULAR ACCESS DIAGNOSTIC CEREBRAL ANGIOGRAM COMPARISON:  CTA head and neck July 17, 2022; MRA July 16, 2022. MEDICATIONS: No antibiotic was administered. ANESTHESIA/SEDATION: Moderate (conscious) sedation was employed during this procedure. A total of Versed 1.5 mg and Fentanyl 20 mcg was administered intravenously by the radiology nurse. Total intra-service moderate Sedation Time: 54 minutes. The patient's level of consciousness and vital signs were monitored continuously by radiology nursing throughout the procedure under my direct supervision. CONTRAST:  108 mL of Omnipaque 300 milligram/mL FLUOROSCOPY: Radiation Exposure Index (as provided by the fluoroscopic device): Peak skin dose  1,277 mGy Kerma COMPLICATIONS: None immediate. TECHNIQUE: Informed written consent was obtained from the patient after a thorough discussion of the procedural risks, benefits and alternatives. All questions were addressed. Maximal Sterile Barrier Technique was utilized including caps, mask, sterile gowns, sterile gloves, sterile drape, hand hygiene and skin antiseptic. A timeout was performed prior to the initiation of the procedure. Using the modified Seldinger technique and a micropuncture kit, access was gained to the distal right radial artery at the anatomical snuffbox and a 5 French sheath was placed. Real-time ultrasound guidance was utilized for vascular access including the acquisition of a permanent ultrasound image documenting patency of the accessed vessel. Slow intra arterial infusion of 5,000 IU heparin, 5 mg Verapamil and 200 mcg nitroglycerin diluted in patient's own blood was performed. No significant fluctuation in patient's blood pressure seen. Then, a right radial artery angiogram was obtained via sheath side port. Normal brachial artery branching pattern seen. No significant anatomical variation. The  right radial artery caliber is adequate for vascular access. Next, a 5 [place] Simmons 2 glide catheter was navigated over a 0.035" Terumo Glidewire into the right subclavian artery under fluoroscopic guidance and subsequently into the right common carotid artery. Frontal and lateral angiograms of the neck were obtained. Using biplane roadmap guidance, the catheter was placed into the right internal carotid artery. Frontal and lateral angiograms of the head were obtained. The catheter was retracted into the right common carotid artery. Using biplane roadmap guidance, the catheter was placed into the right external carotid artery. Frontal and lateral angiograms of the head were obtained. Next, the catheter was placed into the left subclavian artery. Using roadmap guidance, the catheter was advanced into the left vertebral artery. Frontal and lateral angiograms of the head were obtained. Subsequently, the catheter was placed into the left common carotid artery. Frontal and lateral angiograms of the neck were obtained. Using biplane roadmap guidance, the catheter was placed into the left internal carotid artery. Frontal and lateral angiograms of the head were obtained. The catheter was then retracted into the left common carotid artery. Using biplane roadmap guidance, the catheter was advanced into the left external carotid artery. Frontal and lateral angiograms of the head were obtained. Then, the catheter was placed into the left occipital artery. Angiograms with frontal, lateral, magnified left anterior oblique and magnified shoulders view of the head were obtained. The catheter was subsequently withdrawn. An inflatable band was placed and inflated over the right hand access site. The vascular sheath was withdrawn and the band was slowly deflated until brisk flow was noted through the arteriotomy site. At this point, the band was reinflated with additional 2 cc of air to obtain patent hemostasis. FINDINGS: Right  radial artery ultrasound and right radial artery angiogram: The caliber of the distal right radial artery is appropriate for angiogram access. The right radial artery and the right ulnar artery have normal course and caliber. No significant anatomical variants noted. Right CCA angiograms: Cervical angiograms show normal course and caliber of the visualized right common carotid and internal carotid arteries. There are no significant stenoses. Right ICA angiograms: There is brisk vascular contrast filling of the right ACA and MCA vascular trees. Luminal caliber is smooth and tapering. No aneurysms or abnormally high-flow, early draining veins are seen. No regions of abnormal hypervascularity are noted. No opacification of the left transverse and sigmoid sinuses. Other dural sinuses are patent. Right ECA angiograms: No early venous drainage was noted. The visualized branches of the right external carotid  artery are unremarkable. Left vertebral artery angiograms: The left vertebral artery, basilar artery, and bilateral posterior cerebral arteries are unremarkable. Luminal caliber is smooth and tapering. No aneurysms or abnormally high-flow, early draining veins are seen. No regions of abnormal hypervascularity are noted. No opacification of the left transverse and sigmoid sinuses. Other dural sinuses are patent. Dilated left occipital and cerebellar veins are noted, suggesting venous hypertension. Left CCA angiograms: Cervical angiograms show normal course and caliber of the visualized left common carotid and internal carotid arteries. There are no significant stenoses. Left ICA angiograms: There is brisk vascular contrast filling of the left ACA and MCA vascular trees. Luminal caliber is smooth and tapering. No aneurysms or abnormally high-flow, early draining veins are seen. No regions of abnormal hypervascularity are noted. No opacification of the left transverse and sigmoid sinuses. Other dural sinuses are patent.  Evidence of dilated veins along the left cerebral hemisphere, suggesting venous hypertension. Left ECA and selective left occipital artery angiograms: Early venous drainage related to dural AV fistula at the left transverse-sigmoid sinus junction. The facial a supplied by the petrosal and petrosquamosal branch from the left middle meningeal artery, and branches of the left occipital artery. The facial drains into a small patent segment of the sinus at the left transverse-sigmoid junction and into the vein of Labbe. There is cortical venous reflux and ectasias. 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 PROCEDURE: No intervention performed. 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 PLAN: Findings discussed with the patient and Dr. Pearlean Brownie. Fistula embolization planned for July 21, 2020. Electronically Signed   By: Baldemar Lenis M.D.   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 The following electrodes were missing or displaced: none. Background: The background consists of intermixed alpha and beta activities. There is a well defined posterior dominant rhythm of 9-10 hz that attenuates with eye opening. Sleep is very briefly recorded. Photic stimulation: Physiologic driving is present EEG Abnormalities: None Clinical Interpretation: This normal EEG is recorded in the waking and sleep state. There was no seizure or seizure  predisposition recorded on this study. Please note that lack of epileptiform activity on EEG does not preclude the possibility of epilepsy. Ritta Slot, MD [facility] [phone] If 7pm- 7am, please page neurology on call as listed in AMION.   VAS [place] LOWER EXTREMITY VENOUS (DVT)  Result Date: 07/17/2022  Lower Venous DVT Study Patient Name:  Donald Galloway  Date of Exam:   07/17/2022 Medical Rec #: [id number]       Accession #:    [id number] Date of Birth: 1965-06-02       Patient Gender: M Patient Age:   58 years Exam Location:  [facility] Procedure:      VAS [place] LOWER EXTREMITY VENOUS (DVT) Referring Phys: Marvel Plan --------------------------------------------------------------------------------  Indications: Thrombophilia [331404].  Risk Factors: None identified. Comparison Study: No prior studies. Performing Technologist:  Chanda Busing RVT  Examination Guidelines: A complete evaluation includes B-mode imaging, spectral Doppler, color Doppler, and power Doppler as needed of all accessible portions of each vessel. Bilateral testing is considered an integral part of a complete examination. Limited examinations for reoccurring indications may be performed as noted. The reflux portion of the exam is performed with the patient in reverse Trendelenburg.  +---------+---------------+---------+-----------+----------+--------------+ RIGHT    CompressibilityPhasicitySpontaneityPropertiesThrombus Aging +---------+---------------+---------+-----------+----------+--------------+ CFV      Full           Yes      Yes                                 +---------+---------------+---------+-----------+----------+--------------+ SFJ      Full                                                        +---------+---------------+---------+-----------+----------+--------------+ FV Prox  Full                                                         +---------+---------------+---------+-----------+----------+--------------+ FV Mid   Full                                                        +---------+---------------+---------+-----------+----------+--------------+ FV DistalFull                                                        +---------+---------------+---------+-----------+----------+--------------+ PFV      Full                                                        +---------+---------------+---------+-----------+----------+--------------+ POP      Full           Yes      Yes                                 +---------+---------------+---------+-----------+----------+--------------+ PTV      Full                                                        +---------+---------------+---------+-----------+----------+--------------+ PERO     Full                                                        +---------+---------------+---------+-----------+----------+--------------+   +---------+---------------+---------+-----------+----------+--------------+  LEFT     CompressibilityPhasicitySpontaneityPropertiesThrombus Aging +---------+---------------+---------+-----------+----------+--------------+ CFV      Full           Yes      Yes                                 +---------+---------------+---------+-----------+----------+--------------+ SFJ      Full                                                        +---------+---------------+---------+-----------+----------+--------------+ FV Prox  Full                                                        +---------+---------------+---------+-----------+----------+--------------+ FV Mid   Full                                                        +---------+---------------+---------+-----------+----------+--------------+ FV DistalFull                                                         +---------+---------------+---------+-----------+----------+--------------+ PFV      Full                                                        +---------+---------------+---------+-----------+----------+--------------+ POP      Full           Yes      Yes                                 +---------+---------------+---------+-----------+----------+--------------+ PTV      Full                                                        +---------+---------------+---------+-----------+----------+--------------+ PERO     Full                                                        +---------+---------------+---------+-----------+----------+--------------+     Summary: RIGHT: - There is no evidence of deep vein thrombosis in the lower extremity.  - No cystic structure found in the popliteal fossa.  LEFT: - There is no evidence of deep vein thrombosis in the lower extremity.  - No  cystic structure found in the popliteal fossa.  *See table(s) above for measurements and observations. Electronically signed by Sherald Hess MD on 07/17/2022 at 3:14:38 PM.    Final    ECHOCARDIOGRAM COMPLETE  Result Date: 07/17/2022    ECHOCARDIOGRAM REPORT   Patient Name:   KIMO BANCROFT Date of Exam: 07/17/2022 Medical Rec #:  [id number]      Height:       67.0 in Accession #:    [id number]     Weight:       175.5 lb Date of Birth:  05/11/1965      BSA:          1.913 m Patient Age:    56 years       BP:           129/88 mmHg Patient Gender: M              HR:           86 bpm. Exam Location:  Inpatient Procedure: 2D Echo, Cardiac Doppler and Color Doppler Indications:    TIA  History:        Patient has no prior history of Echocardiogram examinations.                 Risk Factors:Hypertension and Dyslipidemia.  Sonographer:    Mike Gip Referring Phys: [id number] PING T ZHANG IMPRESSIONS  1. Left ventricular ejection fraction, by estimation, is 60 to 65%. The left ventricle has normal function. The left  ventricle has no regional wall motion abnormalities. Left ventricular diastolic parameters were normal.  2. Right ventricular systolic function is normal. The right ventricular size is normal.  3. The mitral valve is normal in structure. Trivial mitral valve regurgitation. No evidence of mitral stenosis.  4. The aortic valve was not well visualized. Aortic valve regurgitation is not visualized. No aortic stenosis is present.  5. The inferior vena cava is normal in size with greater than 50% respiratory variability, suggesting right atrial pressure of 3 mmHg. FINDINGS  Left Ventricle: Left ventricular ejection fraction, by estimation, is 60 to 65%. The left ventricle has normal function. The left ventricle has no regional wall motion abnormalities. The left ventricular internal cavity size was normal in size. There is  no left ventricular hypertrophy. Left ventricular diastolic parameters were normal. Right Ventricle: The right ventricular size is normal. No increase in right ventricular wall thickness. Right ventricular systolic function is normal. Left Atrium: Left atrial size was normal in size. Right Atrium: Right atrial size was normal in size. Pericardium: There is no evidence of pericardial effusion. Mitral Valve: The mitral valve is normal in structure. Trivial mitral valve regurgitation. No evidence of mitral valve stenosis. Tricuspid Valve: The tricuspid valve is normal in structure. Tricuspid valve regurgitation is trivial. No evidence of tricuspid stenosis. Aortic Valve: The aortic valve was not well visualized. Aortic valve regurgitation is not visualized. No aortic stenosis is present. Pulmonic Valve: The pulmonic valve was normal in structure. Pulmonic valve regurgitation is not visualized. No evidence of pulmonic stenosis. Aorta: The aortic root is normal in size and structure. Venous: The inferior vena cava is normal in size with greater than 50% respiratory variability, suggesting right atrial  pressure of 3 mmHg. IAS/Shunts: No atrial level shunt detected by color flow Doppler.  LEFT VENTRICLE PLAX 2D LVIDd:         4.80 cm      Diastology LVIDs:  3.20 cm      LV e' medial:    7.62 cm/s LV PW:         0.60 cm      LV E/e' medial:  9.4 LV IVS:        0.70 cm      LV e' lateral:   14.10 cm/s LVOT diam:     2.10 cm      LV E/e' lateral: 5.1 LV SV:         55 LV SV Index:   29 LVOT Area:     3.46 cm  LV Volumes (MOD) LV vol d, MOD A2C: 101.0 ml LV vol d, MOD A4C: 121.0 ml LV vol s, MOD A2C: 40.1 ml LV vol s, MOD A4C: 47.8 ml LV SV MOD A2C:     60.9 ml LV SV MOD A4C:     121.0 ml LV SV MOD BP:      65.8 ml RIGHT VENTRICLE             IVC RV Basal diam:  3.90 cm     IVC diam: 1.80 cm RV S prime:     11.40 cm/s TAPSE (M-mode): 2.1 cm LEFT ATRIUM           Index        RIGHT ATRIUM           Index LA diam:      3.30 cm 1.73 cm/m   RA Area:     13.20 cm LA Vol (A2C): 30.1 ml 15.73 ml/m  RA Volume:   26.00 ml  13.59 ml/m LA Vol (A4C): 27.1 ml 14.17 ml/m  AORTIC VALVE LVOT Vmax:   84.60 cm/s LVOT Vmean:  56.500 cm/s LVOT VTI:    0.158 m  AORTA Ao Root diam: 2.70 cm MITRAL VALVE MV Area (PHT): 3.37 cm    SHUNTS MV Decel Time: 225 msec    Systemic VTI:  0.16 m MV E velocity: 71.80 cm/s  Systemic Diam: 2.10 cm MV A velocity: 83.80 cm/s MV E/A ratio:  0.86 Arvilla Meres MD Electronically signed by Arvilla Meres MD Signature Date/Time: 07/17/2022/1:27:06 PM    Final    CT ANGIO HEAD NECK W WO CM  Result Date: 07/17/2022 CLINICAL DATA:  Stroke/TIA, determine embolic source EXAM: CT ANGIOGRAPHY HEAD AND NECK WITH AND WITHOUT CONTRAST TECHNIQUE: Multidetector CT imaging of the head and neck was performed using the standard protocol during bolus administration of intravenous contrast. Multiplanar CT image reconstructions and MIPs were obtained to evaluate the vascular anatomy. Carotid stenosis measurements (when applicable) are obtained utilizing NASCET criteria, using the distal internal carotid  diameter as the denominator. RADIATION DOSE REDUCTION: This exam was performed according to the departmental dose-optimization program which includes automated exposure control, adjustment of the mA and/or kV according to patient size and/or use of iterative reconstruction technique. CONTRAST:  75mL OMNIPAQUE IOHEXOL 350 MG/ML SOLN COMPARISON:  None Available. FINDINGS: CT HEAD FINDINGS Brain: Similar small volume of acute subarachnoid hemorrhage described on priors. No evidence of acute large vascular territory infarct, mass lesion, midline shift or hydrocephalus. Vascular: See below. Skull: No acute fracture. Sinuses/Orbits: Right sphenoid sinus frothy secretions. Acute orbital findings. Other: No mastoid effusions. Review of the MIP images confirms the above findings CTA NECK FINDINGS Aortic arch: Great vessel origins are patent without significant stenosis. Right carotid system: No evidence of dissection, stenosis (50% or greater), or occlusion. Left carotid system: Atherosclerosis at the carotid bifurcation without greater than  50% stenosis. Vertebral arteries: Left dominant. No evidence of dissection, stenosis (50% or greater), or occlusion. Skeleton: No acute fracture. Other neck: No acute abnormality on limited assessment. Upper chest: Visualized lung apices are clear. Review of the MIP images confirms the above findings CTA HEAD FINDINGS Anterior circulation: Bilateral intracranial ICAs, MCAs, and ACAs are patent without proximal hemodynamically significant stenosis. Posterior circulation: Bilateral intradural vertebral arteries, basilar artery and bilateral posterior cerebral arteries are patent without proximal hemodynamically significant stenosis Venous sinuses: Similar dural venous sinus thrombosis, better characterized on recent CTV. Similar dural AV fistula, better characterized on recent MRA. Review of the MIP images confirms the above findings IMPRESSION: 1. No large vessel occlusion or proximal  hemodynamically significant stenosis. 2. Similar small volume acute subarachnoid hemorrhage. 3. Similar dural venous sinus thrombosis, better characterized on recent CTV. 4. Similar AV fistula, better characterized on recent MRA. Electronically Signed   By: Feliberto Harts M.D.   On: 07/17/2022 13:13   MR ANGIO HEAD WO CONTRAST  Result Date: 07/16/2022 CLINICAL DATA:  Follow-up brain MRI, rule out AVM. EXAM: MRA HEAD WITHOUT CONTRAST TECHNIQUE: Angiographic images of the Circle of Willis were acquired using MRA technique without intravenous contrast. COMPARISON:  Brain MRI from earlier today FINDINGS: Hypertrophic left occipital artery with arterial signal at the left transverse sigmoid sinus and also filling the left vein of Labbe. No arterial stenosis, beading, or aneurysm. 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 Electronically Signed   By: Tiburcio Pea M.D.   On: 07/16/2022 21:51   MR BRAIN WO CONTRAST  Result Date: 07/16/2022 CLINICAL DATA:  Transient ischemic attack (TIA) EXAM: MRI HEAD WITHOUT CONTRAST TECHNIQUE: Multiplanar, multiecho pulse sequences of the brain and surrounding structures were obtained without intravenous contrast. COMPARISON:  None Available. FINDINGS: Brain: No acute infarction, hydrocephalus, extra-axial collection or mass lesion. Small focus of sulcal FLAIR hyperintensity in the posterior aspect of the left sylvian fissure is compatible with small volume acute subarachnoid hemorrhage. On susceptibility weighted imaging prominent serpiginous vessels in the left cerebellum, left parieto-occipital and posterior temporal regions. Vascular: Abnormal signal in the left transverse and sigmoid sinuses and left jugular bulb. Major arterial flow voids are maintained at the skull base. Skull and upper cervical spine: Normal marrow signal. Sinuses/Orbits: Negative. IMPRESSION: 1. Abnormal signal in the left transverse and sigmoid  sinuses and left jugular bulb, compatible with dural venous sinus thrombosis seen on same day CT venogram. No evidence of adjacent acute infarct. 2. Small focus of sulcal FLAIR hyperintensity in the posterior aspect of the left sylvian fissure is compatible with small volume acute subarachnoid hemorrhage. 3. On susceptibility weighted imaging prominent serpiginous vessels in the left cerebellum, left parieto-occipital and posterior temporal regions (adjacent to the thrombosed sinuses). This probably represents venous congestion due to the dural venous thrombosis described above, but MRA is recommended to exclude dural AV fistula or AVM as a cause of the patient's sinus thrombosis. Electronically Signed   By: Feliberto Harts M.D.   On: 07/16/2022 16:09   CT VENOGRAM HEAD  Result Date: 07/16/2022 CLINICAL DATA:  Dural venous sinus thrombosis suspected EXAM: CT VENOGRAM HEAD TECHNIQUE: Venographic phase images of the brain were obtained following the administration of intravenous contrast. Multiplanar reformats and maximum intensity projections were generated. RADIATION DOSE REDUCTION: This exam was performed according to the departmental dose-optimization program which includes automated exposure control, adjustment of the mA and/or kV according to patient size and/or use of iterative reconstruction technique.  CONTRAST:  75mL OMNIPAQUE IOHEXOL 350 MG/ML SOLN COMPARISON:  No prior CT venogram available, correlation is made with CT head 07/16/2022 10:40 a.m. FINDINGS: Filling defect in the left transverse sinus (series 3, images 12-13 and series 7, images 169-22), left sigmoid sinus (series 7, images 137-151 and series 3, images 5-9), and in the jugular bulb (series 3, images 2-4 and series 7, images 124-130). Otherwise patent venous sinuses. IMPRESSION: Filling defect in the left transverse sinus, left sigmoid sinus, and left jugular bulb, concerning for thrombosis. These results were called by telephone at the  time of interpretation on 07/16/2022 at 2:22 pm to provider [facility] RAY , who verbally acknowledged these results. Electronically Signed   By: Wiliam Ke M.D.   On: 07/16/2022 14:24   CT HEAD WO CONTRAST  Result Date: 07/16/2022 CLINICAL DATA:  Altered mental status EXAM: CT HEAD WITHOUT CONTRAST TECHNIQUE: Contiguous axial images were obtained from the base of the skull through the vertex without intravenous contrast. RADIATION DOSE REDUCTION: This exam was performed according to the departmental dose-optimization program which includes automated exposure control, adjustment of the mA and/or kV according to patient size and/or use of iterative reconstruction technique. COMPARISON:  None Available. FINDINGS: Brain: No evidence of acute infarction, hydrocephalus, or mass lesion/mass effect. Linear hyperdensity in the left middle cranial fossa is nonspecific, but could represent a small amount of subarachnoid blood products (series 3, image 10). Vascular: Asymmetrically hyperdense appearance of the left sigmoid/transverse sinus junction (series 3, image 13). Skull: Normal. Negative for fracture or focal lesion. Sinuses/Orbits: No middle ear or mastoid effusion. Frothy secretions in the right sphenoid sinus, which can be seen in the setting of acute sinusitis. Orbits are unremarkable. Other: None. IMPRESSION: 1. Asymmetrically hyperdense appearance of the left sigmoid/transverse sinus junction, concerning for dural venous sinus thrombosis. Recommend further evaluation with a CT venogram. 2. Linear hyperdensity in the left middle cranial fossa is nonspecific, but could represent a small amount of subarachnoid blood products. 3. Frothy secretions in the right sphenoid sinus, which can be seen in the setting of acute sinusitis. Electronically Signed   By: Lorenza Cambridge M.D.   On: 07/16/2022 11:13      HISTORY OF PRESENT ILLNESS Mr. MARQUEZE RAMCHARAN is a 57 y.o. male with history of hypertension, hyperlipidemia,  and sleep apnea who presented to the ED 07/16/22 for evaluation of fluctuating levels of altered mental status with onset on 07/15/2022.  The patient states that while he was at work yesterday, he felt that he was having trouble comprehending others as they were speaking to him and at times, he "zoned out" and did not hear their spoken words at all.  Complained of a headache    HOSPITAL COURSE CVST: Left sigmoid and transverse dural venous sinus thrombosis with small cortical SAH, likely due to occipital dural AVF s/p coil embolization on 07/22/2018 CT head  Asymmetrically hyperdense appearance of the left sigmoid/transverse sinus junction, concerning for dural venous sinus thrombosis. Linear hyperdensity in the left middle cranial fossa is nonspecific, but could represent a small amount of subarachnoid blood products. CTV Filling defect in the left transverse sinus, left sigmoid sinus, and left jugular bulb, concerning for thrombosis  CTA head & neck no LVO, AV fistula, small SAH, dural venous sinus thrombosis MRI  Abnormal signal in the left transverse and sigmoid sinuses and left jugular bulb, compatible with dural venous sinus thrombosis MRA AV fistula from left occipital branches to left transverse sigmoid junction IR cerebral angio :  Cognard type IV dural AV fistula supplied by branches of the left middle meningeal artery and occipital artery and draining into a small patent segment of the left transverse sigmoid sinus junction and vein of Labbe.  2D Echo EF 60 to 65% Venous doppler LE bilaterally for DVT EEG This normal EEG is recorded in the waking and sleep state. There was no seizure or seizure predisposition recorded on this study. Please note that lack of epileptiform activity on EEG does not preclude the possibility of epilepsy.  LDL 99 HgbA1c 5.2 UDS neg Hypercoagulable work up pending  but homocystine and anticardiolipin antibodies are normal. Remaining results will be discussed in  neurology f/u appt.  VTE prophylaxis -IV heparin 81 mg prior to admission, now Eliquis 5 mg twice daily Therapy recommendations: none Disposition: Pending   AVF MRA and CTA head and neck showed AV fistula from left occipital branches to left transverse sigmoid junction Could be potentially the cause of CVST, but low possibility of the consequences of chronic CVST Discussed with Dr. Sherlon Handing from IR DSA dural venous sinus thrombosis and dural AV fistula NIR procedure 4/24:  Redemonstrated left transverse/sigmoid sinus dural AV fistula. Endovascular embolization performed with Onyx liquid embolic agent via left MMA and left Occipital artery branches. No residual AV fistula noted at the end of the procedure. Started on Eliquis 5 mg twice daily Plan for 6 month MRV as OP; then to determine timing of Angiogram    Hypertension Home meds: lisinopril 20 mg, Stable On lisino 20 and norvasc 5 BP goal<160 Long-term BP goal normotensive   Hyperlipidemia Home meds: Crestor 5 mg LDL 99, goal < 70 On crestor 20 Continue statin at discharge   Other Stroke Risk Factors Obstructive sleep apnea, on CPAP at home   DISCHARGE EXAM Blood pressure 133/75, pulse (!) 104, temperature 98.3 F (36.8 C), temperature source Oral, resp. rate 18, height 5\' 7"  (1.702 m), weight 79.6 kg, SpO2 100 %.  General -  Pleasant middle-age male not in distress.  Afebrile.  Cardiac exam no murmur or gallop.  Lungs clear to auscultation.  Distal pulses well felt.  Groin is soft nontender Neurological Exam ;  Awake  Alert oriented x 3. Normal speech and language.eye movements full without nystagmus.fundi were not visualized. Vision acuity and fields appear normal. Hearing is normal. Palatal movements are normal. Face symmetric. Tongue midline. Normal strength, tone, reflexes and coordination. Normal sensation. Gait deferred.   Discharge Diet       Diet   Diet regular Room service appropriate? Yes with Assist; Fluid  consistency: Thin   liquids  DISCHARGE PLAN Disposition:  Home Eliquis 5 mg twice daily for secondary stroke prevention  Maintain aggressive hydration Ongoing stroke risk factor control by Primary Care Physician at time of discharge Follow-up PCP in 2 weeks. Follow-up in [facility] withy Dr Precious Reel 8 weeks, office to schedule an appointment.   35 minutes were spent preparing discharge.   Pt seen by Neuro NP/APP and later by MD. Note/plan to be edited by MD as needed.    Lynnae January, DNP, AGACNP-BC [facility] Please use AMION for contact information & EPIC for messaging.  I have personally obtained history,examined this patient, reviewed notes, independently viewed imaging studies, participated in medical decision making and plan of care.ROS completed by me personally and pertinent positives fully documented  I have made any additions or clarifications directly to the above note. Agree with note above.    Delia Heady,  MD Medical Director [facility] Pager: [phone] 07/24/2022 11:25 AM

## 2022-07-23 NOTE — Progress Notes (Signed)
Pt SBP goal 100-160 per Dr. Quay Burow after IR procedure. Pt's SBP consistently in 90s, occasionally dipping into high 80s, with MAPs below 65 after fluid bolus. E-Link notified. Levo gtt started.

## 2022-07-23 NOTE — Progress Notes (Signed)
NAME:  Donald Galloway, MRN:  [id number], DOB:  06-02-1965, LOS: 7 ADMISSION DATE:  07/16/2022, CONSULTATION DATE:  07/22/22 REFERRING MD:  Maryln Gottron, CHIEF COMPLAINT:  headache   History of Present Illness:  57 year old male with PMH of HLD, HTN, and OSA who presented on 4/18 after 2 day history of fluctuating altered mental status and language issues with frontal headache.  Workup revealing for dural sinus thrombosis, confirmed with CT venogram.  MRI showed small SAH.  Admitted to [facility] with neurology consulting.  Started on heparin gtt.  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  NIR consulted, underwent angiography which confirmed large AVM.  Patient taken for embolization on 4/24 with Dr. Sherlon Handing.  PCCM consulted for medical management following postprocedure in Neuro ICU.   Pertinent  Medical History  HTN, HLD, OSA on CPAP   Significant Hospital Events: Including procedures, antibiotic start and stop dates in addition to other pertinent events   4/18: admitted, started on heparin, Neurology consulted 4/19: CTA showed intracranial AVM 4/22: Underwent angiography that confirmed large AVM 4/24: NIR embolization of AVM  Subjective   No complaints or neuro changes overnight Required low dose NE overnight for SBP goal 100-160  Objective   Blood pressure 107/65, pulse 65, temperature 98.7 F (37.1 C), temperature source Oral, resp. rate 16, height  (1.702 m), weight 79.6 kg, SpO2 97 %.        Intake/Output Summary (Last 24 hours) at 07/23/2022 0802 Last data filed at 07/23/2022 0700 Gross per 24 hour  Intake 3270.28 ml  Output 4250 ml  Net -979.72 ml   Filed Weights   07/16/22 1003 07/16/22 2017  Weight: 80.7 kg 79.6 kg   Examination: General:  Adult male sitting upright in bed in NAD HEENT: MM pink/moist, pupils 3/r, anicteric Neuro: Alert, oriented, non focal, MAE CV: rr, NSR, no murmur, left radial aline, R groin site dressing CDI, +2dp PULM:  non labored, CTA, RA GI: soft, bs+, NT/  ND, foley-cyu Extremities: warm/dry, no LE edema  Skin: no rashes     Labs reviewed UOP 4.1L/ 24hrs Net +7.1L Afebrile  Assessment & Plan:   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  NE just turned off, d/c aline if remains stable off pressors; further BP management as below - further imaging per Neuro/ NIR, stat CTH for any neuro changes - d/c foley  - serial neuro exams - heparin per pharmacy, anticipate transition to oral AC soon, defer to Neuro/ NIR - homocystine and anticardiolipin antibodies normal.  Hypercoagulable workup pending  Nausea and headache - 2/2 above. - no current issues.  Prn zofran  OSA on CPAP - Continue nocturnal CPAP  Hx HTN, HLD. - Continue home Amlodipine, Lisinopril if remains off pressors - cont crestor - SBP goal 100 - 160 per neuro  Hypokalemia - replete prn, Mag ok - trend BMET   Best Practice (right click and "Reselect all SmartList Selections" daily)   Diet/type: Regular consistency (see orders)  DVT prophylaxis: systemic heparin GI prophylaxis: PPI Lines: N/A Foley:  Yes, and it is no longer needed and removal ordered  Code Status:  full code Last date of multidisciplinary goals of  care discussion: per primary  Patient and wife updated at bedside.   Labs   CBC: Recent Labs  Lab 07/16/22 1017 07/16/22 1034 07/19/22 0305 07/20/22 0453 07/21/22 0445 07/22/22 0332 07/22/22 1328 07/23/22 0450  WBC 5.9   < > 6.8 7.2 6.2 6.6  --  7.7  NEUTROABS 3.4  --   --   --   --  2.7  --  6.0  HGB 15.2   < > 13.6 13.6 13.8 14.3 13.3 11.8*  HCT 43.4   < > 39.6 39.7 38.6* 40.8 39.0 34.2*  MCV 86.1   < > 87.6 87.4 83.9 86.3  --  86.4   PLT 281   < > 234 224 220 238  --  213   < > = values in this interval not displayed.    Basic Metabolic Panel: Recent Labs  Lab 07/16/22 1017 07/16/22 1034 07/22/22 0332 07/22/22 1328 07/23/22 0450  NA 137 138 138 139 139  K 4.0 4.1 3.3* 3.4* 3.9  CL 101 101 104  --  110  CO2 26  --  24  --  20*  GLUCOSE 108* 105* 91  --  147*  BUN --  8  CREATININE 1.07 1.00 0.89  --  0.78  CALCIUM 9.6  --  8.8*  --  8.3*  MG  --   --  2.1  --  2.0  PHOS  --   --   --   --  3.0   GFR: Estimated Creatinine Clearance: 103 mL/min (by C-G formula based on SCr of 0.78 mg/dL). Recent Labs  Lab 07/20/22 0453 07/21/22 0445 07/22/22 0332 07/23/22 0450  WBC 7.2 6.2 6.6 7.7    Liver Function Tests: Recent Labs  Lab 07/16/22 1017 07/23/22 0450  AST 26  --   ALT 37  --   ALKPHOS 48  --   BILITOT 1.0  --   PROT 7.1  --   ALBUMIN 4.3 3.1*   No results for input(s): "LIPASE", "AMYLASE" in the last 168 hours. No results for input(s): "AMMONIA" in the last 168 hours.  ABG    Component Value Date/Time   PHART 7.371 07/22/2022 1328   PCO2ART 34.9 07/22/2022 1328   PO2ART 220 (H) 07/22/2022 1328   HCO3 20.2 07/22/2022 1328   TCO2 21 (L) 07/22/2022 1328   ACIDBASEDEF 4.0 (H) 07/22/2022 1328   O2SAT 100 07/22/2022 1328     Coagulation Profile: Recent Labs  Lab 07/16/22 1017 07/20/22 0453 07/22/22 0332  INR 1.0 1.1 1.1    Cardiac Enzymes: No results for input(s): "CKTOTAL", "CKMB", "CKMBINDEX", "TROPONINI" in the last 168 hours.  HbA1C: Hgb A1c MFr Bld  Date/Time Value Ref Range Status  07/16/2022 08:20 PM 5.2 4.8 - 5.6 % Final    Comment:    (NOTE) Pre diabetes:          5.7%-6.4%  Diabetes:              >6.4%  Glycemic control for   <7.0% adults with diabetes     CBG: No results for input(s): "GLUCAP" in the last 168 hours.    Critical care time: 32 mins      Posey Boyer, MSN, AG-ACNP-BC [facility] 07/23/2022, 8:02  AM  See Amion for pager If no response to pager, please call PCCM consult pager After 7:00 pm call Elink

## 2022-07-23 NOTE — Plan of Care (Signed)
  Problem: Education: Goal: Knowledge of disease or condition will improve Outcome: Progressing Goal: Knowledge of secondary prevention will improve (MUST DOCUMENT ALL) Outcome: Progressing Goal: Knowledge of patient specific risk factors will improve (Mark N/A or DELETE if not current risk factor) Outcome: Progressing   Problem: Ischemic Stroke/TIA Tissue Perfusion: Goal: Complications of ischemic stroke/TIA will be minimized Outcome: Progressing   Problem: Coping: Goal: Will verbalize positive feelings about self Outcome: Progressing Goal: Will identify appropriate support needs Outcome: Progressing   Problem: Health Behavior/Discharge Planning: Goal: Ability to manage health-related needs will improve Outcome: Progressing Goal: Goals will be collaboratively established with patient/family Outcome: Progressing   Problem: Self-Care: Goal: Ability to participate in self-care as condition permits will improve Outcome: Progressing Goal: Verbalization of feelings and concerns over difficulty with self-care will improve Outcome: Progressing Goal: Ability to communicate needs accurately will improve Outcome: Progressing   Problem: Nutrition: Goal: Risk of aspiration will decrease Outcome: Progressing Goal: Dietary intake will improve Outcome: Progressing   Problem: Education: Goal: Knowledge of General Education information will improve Description: Including pain rating scale, medication(s)/side effects and non-pharmacologic comfort measures Outcome: Progressing   Problem: Health Behavior/Discharge Planning: Goal: Ability to manage health-related needs will improve Outcome: Progressing   Problem: Clinical Measurements: Goal: Ability to maintain clinical measurements within normal limits will improve Outcome: Progressing Goal: Will remain free from infection Outcome: Progressing Goal: Diagnostic test results will improve Outcome: Progressing Goal: Respiratory  complications will improve Outcome: Progressing Goal: Cardiovascular complication will be avoided Outcome: Progressing   Problem: Activity: Goal: Risk for activity intolerance will decrease Outcome: Progressing   Problem: Nutrition: Goal: Adequate nutrition will be maintained Outcome: Progressing   Problem: Coping: Goal: Level of anxiety will decrease Outcome: Progressing   Problem: Elimination: Goal: Will not experience complications related to bowel motility Outcome: Progressing Goal: Will not experience complications related to urinary retention Outcome: Progressing   Problem: Pain Managment: Goal: General experience of comfort will improve Outcome: Progressing   Problem: Safety: Goal: Ability to remain free from injury will improve Outcome: Progressing   Problem: Skin Integrity: Goal: Risk for impaired skin integrity will decrease Outcome: Progressing   Problem: Education: Goal: Understanding of CV disease, CV risk reduction, and recovery process will improve Outcome: Progressing Goal: Individualized Educational Video(s) Outcome: Progressing   Problem: Activity: Goal: Ability to return to baseline activity level will improve Outcome: Progressing   Problem: Cardiovascular: Goal: Ability to achieve and maintain adequate cardiovascular perfusion will improve Outcome: Progressing Goal: Vascular access site(s) Level 0-1 will be maintained Outcome: Progressing   Problem: Health Behavior/Discharge Planning: Goal: Ability to safely manage health-related needs after discharge will improve Outcome: Progressing   

## 2022-07-23 NOTE — Progress Notes (Signed)
eLink Physician-Brief Progress Note Patient Name: Donald Galloway DOB: 26-Jan-1966 MRN: [id number]   Date of Service  07/23/2022  HPI/Events of Note  Patient's blood pressure dipping below parameters of 100 - 160 mmHg while patient is asleep. 24 hour fluid balance is + 300 ml.  eICU Interventions  Will trial a 500 ml NS bolus to try and bring SBP above 100 mmHg.        Thomasene Lot [name] 07/23/2022, 2:45 AM

## 2022-07-24 LAB — CBC
HCT: 36.3 % — ABNORMAL LOW (ref 39.0–52.0)
Hemoglobin: 12.9 g/dL — ABNORMAL LOW (ref 13.0–17.0)
MCH: 30.6 pg (ref 26.0–34.0)
MCHC: 35.5 g/dL (ref 30.0–36.0)
MCV: 86.2 fL (ref 80.0–100.0)
Platelets: 224 10*3/uL (ref 150–400)
RBC: 4.21 MIL/uL — ABNORMAL LOW (ref 4.22–5.81)
RDW: 11.9 % (ref 11.5–15.5)
WBC: 8.1 10*3/uL (ref 4.0–10.5)
nRBC: 0 % (ref 0.0–0.2)

## 2022-07-24 LAB — POCT ACTIVATED CLOTTING TIME
Activated Clotting Time: 201 seconds
Activated Clotting Time: 206 seconds
Activated Clotting Time: 217 seconds

## 2022-07-24 MED ORDER — LISINOPRIL 20 MG PO TABS: 20.0000 mg | ORAL_TABLET | Freq: Every day | ORAL | 0 refills | Status: AC

## 2022-07-24 MED ORDER — APIXABAN 5 MG PO TABS: 5.0000 mg | ORAL_TABLET | Freq: Two times a day (BID) | ORAL | 1 refills | Status: DC

## 2022-07-24 MED ORDER — ROSUVASTATIN CALCIUM 20 MG PO TABS: 20.0000 mg | ORAL_TABLET | Freq: Every day | ORAL | 0 refills | Status: AC

## 2022-07-24 MED ORDER — AMLODIPINE BESYLATE 5 MG PO TABS: 5.0000 mg | ORAL_TABLET | Freq: Every day | ORAL | 0 refills | Status: AC

## 2022-07-24 NOTE — Progress Notes (Signed)
Patient alert and oriented, both wife and patient verbalized understanding of dc instructions.all belongings given to patient.

## 2022-07-24 NOTE — Plan of Care (Signed)
  Problem: Education: Goal: Knowledge of disease or condition will improve Outcome: Progressing Goal: Knowledge of secondary prevention will improve (MUST DOCUMENT ALL) Outcome: Progressing Goal: Knowledge of patient specific risk factors will improve Loraine Leriche N/A or DELETE if not current risk factor) Outcome: Progressing   Problem: Ischemic Stroke/TIA Tissue Perfusion: Goal: Complications of ischemic stroke/TIA will be minimized Outcome: Progressing   Problem: Health Behavior/Discharge Planning: Goal: Ability to manage health-related needs will improve Outcome: Progressing Goal: Goals will be collaboratively established with patient/family Outcome: Progressing   Problem: Self-Care: Goal: Ability to participate in self-care as condition permits will improve Outcome: Progressing Goal: Verbalization of feelings and concerns over difficulty with self-care will improve Outcome: Progressing Goal: Ability to communicate needs accurately will improve Outcome: Progressing   Problem: Clinical Measurements: Goal: Ability to maintain clinical measurements within normal limits will improve Outcome: Progressing Goal: Will remain free from infection Outcome: Progressing Goal: Diagnostic test results will improve Outcome: Progressing Goal: Respiratory complications will improve Outcome: Progressing Goal: Cardiovascular complication will be avoided Outcome: Progressing

## 2022-07-24 NOTE — Plan of Care (Signed)
  Problem: Education: Goal: Knowledge of disease or condition will improve Outcome: Adequate for Discharge Goal: Knowledge of secondary prevention will improve (MUST DOCUMENT ALL) Outcome: Adequate for Discharge Goal: Knowledge of patient specific risk factors will improve Donald Galloway N/A or DELETE if not current risk factor) Outcome: Adequate for Discharge   Problem: Ischemic Stroke/TIA Tissue Perfusion: Goal: Complications of ischemic stroke/TIA will be minimized Outcome: Adequate for Discharge   Problem: Coping: Goal: Will verbalize positive feelings about self Outcome: Adequate for Discharge Goal: Will identify appropriate support needs Outcome: Adequate for Discharge   Problem: Health Behavior/Discharge Planning: Goal: Ability to manage health-related needs will improve Outcome: Adequate for Discharge Goal: Goals will be collaboratively established with patient/family Outcome: Adequate for Discharge   Problem: Self-Care: Goal: Ability to participate in self-care as condition permits will improve Outcome: Adequate for Discharge Goal: Verbalization of feelings and concerns over difficulty with self-care will improve Outcome: Adequate for Discharge Goal: Ability to communicate needs accurately will improve Outcome: Adequate for Discharge   Problem: Nutrition: Goal: Risk of aspiration will decrease Outcome: Adequate for Discharge Goal: Dietary intake will improve Outcome: Adequate for Discharge   Problem: Education: Goal: Knowledge of General Education information will improve Description: Including pain rating scale, medication(s)/side effects and non-pharmacologic comfort measures Outcome: Adequate for Discharge   Problem: Health Behavior/Discharge Planning: Goal: Ability to manage health-related needs will improve Outcome: Adequate for Discharge   Problem: Clinical Measurements: Goal: Ability to maintain clinical measurements within normal limits will improve Outcome:  Adequate for Discharge Goal: Will remain free from infection Outcome: Adequate for Discharge Goal: Diagnostic test results will improve Outcome: Adequate for Discharge Goal: Respiratory complications will improve Outcome: Adequate for Discharge Goal: Cardiovascular complication will be avoided Outcome: Adequate for Discharge   Problem: Activity: Goal: Risk for activity intolerance will decrease Outcome: Adequate for Discharge   Problem: Nutrition: Goal: Adequate nutrition will be maintained Outcome: Adequate for Discharge   Problem: Coping: Goal: Level of anxiety will decrease Outcome: Adequate for Discharge   Problem: Elimination: Goal: Will not experience complications related to bowel motility Outcome: Adequate for Discharge Goal: Will not experience complications related to urinary retention Outcome: Adequate for Discharge   Problem: Pain Managment: Goal: General experience of comfort will improve Outcome: Adequate for Discharge   Problem: Safety: Goal: Ability to remain free from injury will improve Outcome: Adequate for Discharge   Problem: Skin Integrity: Goal: Risk for impaired skin integrity will decrease Outcome: Adequate for Discharge   Problem: Education: Goal: Understanding of CV disease, CV risk reduction, and recovery process will improve Outcome: Adequate for Discharge Goal: Individualized Educational Video(s) Outcome: Adequate for Discharge   Problem: Activity: Goal: Ability to return to baseline activity level will improve Outcome: Adequate for Discharge   Problem: Cardiovascular: Goal: Ability to achieve and maintain adequate cardiovascular perfusion will improve Outcome: Adequate for Discharge Goal: Vascular access site(s) Level 0-1 will be maintained Outcome: Adequate for Discharge   Problem: Health Behavior/Discharge Planning: Goal: Ability to safely manage health-related needs after discharge will improve Outcome: Adequate for  Discharge

## 2022-07-28 ENCOUNTER — Encounter (HOSPITAL_COMMUNITY): Payer: Self-pay

## 2022-07-30 LAB — PROTHROMBIN GENE MUTATION

## 2022-07-31 LAB — FACTOR 5 LEIDEN

## 2022-07-31 NOTE — Progress Notes (Signed)
Kindly inform the blood test for abnormal gene for clotting was fine.

## 2022-08-03 ENCOUNTER — Telehealth: Payer: Self-pay

## 2022-08-03 NOTE — Telephone Encounter (Signed)
Called pt and LVM #1  "If patient calls back please advise patient that per Dr. Pearlean Brownie "inform patient that the blood test for abnormal gene for clotting was fine".

## 2022-08-03 NOTE — Telephone Encounter (Signed)
-----   Message from Deatra James, RN sent at 08/03/2022  9:31 AM EDT -----  ----- Message ----- From: Micki Riley, MD Sent: 07/31/2022   8:34 AM EDT To: Gna-Pod 2 Results  Kindly inform the blood test for abnormal gene for clotting was fine.

## 2022-08-03 NOTE — Telephone Encounter (Signed)
Pt called, message relayed.  This is FYI no call back requested.

## 2022-08-10 ENCOUNTER — Encounter (HOSPITAL_COMMUNITY): Payer: Self-pay

## 2022-08-10 ENCOUNTER — Other Ambulatory Visit (HOSPITAL_COMMUNITY): Payer: Self-pay | Admitting: Radiology

## 2022-08-10 DIAGNOSIS — G08 Intracranial and intraspinal phlebitis and thrombophlebitis: Secondary | ICD-10-CM

## 2022-08-10 HISTORY — PX: IR NEURO EACH ADD'L AFTER BASIC UNI LEFT (MS): IMG5373

## 2022-08-11 ENCOUNTER — Other Ambulatory Visit (HOSPITAL_COMMUNITY): Payer: Self-pay | Admitting: Neuroradiology

## 2022-08-11 ENCOUNTER — Encounter (HOSPITAL_COMMUNITY): Payer: Self-pay

## 2022-08-11 DIAGNOSIS — I609 Nontraumatic subarachnoid hemorrhage, unspecified: Secondary | ICD-10-CM

## 2022-08-11 DIAGNOSIS — Q282 Arteriovenous malformation of cerebral vessels: Secondary | ICD-10-CM

## 2022-08-14 NOTE — Progress Notes (Signed)
Kindly inform the patient that the blood test for abnormal gene for excessive clotting was negative

## 2022-09-03 MED ORDER — APIXABAN 5 MG PO TABS: 5.0000 mg | ORAL_TABLET | Freq: Two times a day (BID) | ORAL | 1 refills | Status: DC

## 2022-10-19 ENCOUNTER — Other Ambulatory Visit: Payer: Self-pay | Admitting: Neurology

## 2022-10-22 ENCOUNTER — Ambulatory Visit (INDEPENDENT_AMBULATORY_CARE_PROVIDER_SITE_OTHER): Payer: 59 | Admitting: Neurology

## 2022-10-22 VITALS — BP 133/88 | HR 94 | Ht 67.0 in | Wt 175.6 lb

## 2022-10-22 DIAGNOSIS — I671 Cerebral aneurysm, nonruptured: Secondary | ICD-10-CM

## 2022-10-22 DIAGNOSIS — G08 Intracranial and intraspinal phlebitis and thrombophlebitis: Secondary | ICD-10-CM

## 2022-10-22 NOTE — Patient Instructions (Signed)
I had a long discussion with the patient about his episode of cerebral edema as well as AV fistula coil embolization answered questions I recommend he continue Eliquis for a total duration of at least 6-9 months and have follow-up angiogram with Dr. Sherlon Handing with repeat scheduled follow-up January 22, 2023.  Maintain hydration.  Return for follow-up in the future in 4 months

## 2022-10-22 NOTE — Progress Notes (Signed)
[facility] [address] [place]. [place] [address] [phone]       OFFICE FOLLOW-UP NOTE  [name] Date of Birth:  Sep 06, 1965 Medical Record Number:  [id number]   HPI: [name] is a 57 year old Caucasian male seen today for initial office follow-up visit following hospital admission for cerebral venous sinus thrombosis in April 2024.  History is obtained from the patient and review of electronic medical records.  I have personally reviewed pertinent available imaging films in PACS.  He has past medical history of hyperlipidemia, hypertension and sleep apnea.  He presented to the emergency room on 07/16/2022 for evaluation of altered mental status since the day prior.  He also complained of headache.  He had some trouble comprehending what others were saying at work and was noted as at times being zoned out and he did not hear the spoken words well.  He also had trouble with his speech output and speech was slurred at times and became gibberish.  He also noticed trouble recalling names of his coworkers and his deficits seem to wax and wane.  He also complained of dull aching frontal and left posterior lateral headache 4/10 in severity which did not improve with ibuprofen.  CT scan of the head showed asymmetric hypodensity in the left sigmoid/transverse sinus junction concerning for dural venous sinus thrombosis.  CT venogram showed a filling defect in the left transverse, sigmoid sinus and jugular bulb concerning for thrombosis.  CT angiogram of the head and neck showed no large vessel occlusion but did show dural AV fistula with small subarachnoid hemorrhage and evidence of dural venous sinus thrombosis on the left.  MRI confirmed abnormal signal in the left transverse and sigmoid sinuses ventricular valve compatible with dural venous sinus thrombosis.  MR angiogram confirmed AV fistula in from left occipital branches to the left transverse sigmoid junction.  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  Patient underwent elective embolization of the dural AV fistula on 07/22/2022 by neurointerventional radiology with Onyx liquid embolic agent via left middle meningeal artery and left occipital artery branches.  No residual AV fistula was noted at the end of the procedure.  And tolerated procedure well without any complication.  LDL cholesterol 99 mg percent.  Hemoglobin A1c was 5.2.  Urine drug screen was negative.  Hypercoagulable panel labs were all normal.  Patient was started on Eliquis 5 mg twice daily which she states she has been taking religiously without any bleeding.  He is also keeping himself well-hydrated.  He is returned back to work full-time and has no issues at work.  He has no complaints today.  He is scheduled to have follow-up cerebral catheter angiogram by Dr. Sherlon Handing scheduled for October 25,/2024 ROS:   14 system review of systems is positive for headache, confusion, bruising and all other systems negative  PMH:  Past Medical History:  Diagnosis Date   Herpes simplex    Hyperlipidemia    Hypertension    Insomnia    Palpitations    evaluated by DR Eldridge Dace   Sleep apnea     Social History:  Social History   Socioeconomic History   Marital status: Single    Spouse name: Not on file   Number of children: Not on file   Years of  education: Not on file   Highest education level: Not on file  Occupational History   Not on file  Tobacco Use   Smoking status: Never   Smokeless tobacco: Not on file  Substance and Sexual Activity   Alcohol use: Yes   Drug use: Never   Sexual activity: Not on file  Other Topics Concern   Not on file  Social History Narrative   Not on file   Social Determinants of Health   Financial Resource  Strain: Not on file  Food Insecurity: No Food Insecurity (07/16/2022)   Hunger Vital Sign    Worried About Running Out of Food in the Last Year: Never true    Ran Out of Food in the Last Year: Never true  Transportation Needs: No Transportation Needs (07/16/2022)   PRAPARE - Administrator, Civil Service (Medical): No    Lack of Transportation (Non-Medical): No  Physical Activity: Not on file  Stress: Not on file  Social Connections: Not on file  Intimate Partner Violence: Not At Risk (07/16/2022)   Humiliation, Afraid, Rape, and Kick questionnaire    Fear of Current or Ex-Partner: No    Emotionally Abused: No    Physically Abused: No    Sexually Abused: No    Medications:   Current Outpatient Medications on File Prior to Visit  Medication Sig Dispense Refill   amLODipine (NORVASC) 5 MG tablet Take 1 tablet (5 mg total) by mouth daily. 30 tablet 0   ELIQUIS 5 MG TABS tablet TAKE 1 TABLET BY MOUTH 2 TIMES DAILY 60 tablet 1   lisinopril (ZESTRIL) 20 MG tablet Take 1 tablet (20 mg total) by mouth daily. 30 tablet 0   loratadine (CLARITIN) 10 MG tablet Take 10 mg by mouth daily as needed for allergies.     rosuvastatin (CRESTOR) 20 MG tablet Take 1 tablet (20 mg total) by mouth daily. 30 tablet 0   traZODone (DESYREL) 100 MG tablet Take 50 mg by mouth at bedtime. Pt stated he took 1/2 tablet the night of 07/15/22     No current facility-administered medications on file prior to visit.    Allergies:   Allergies  Allergen Reactions   Zolpidem Tartrate     Leaves him groggy in the AM    Physical Exam General: well developed, well nourished pleasant middle-aged Caucasian male, seated, in no evident distress Head: head normocephalic and atraumatic.  Neck: supple with no carotid or supraclavicular bruits Cardiovascular: regular rate and rhythm, no murmurs Musculoskeletal: no deformity Skin:  no rash/petichiae Vascular:  Normal pulses all extremities Vitals:   10/22/22  0843  BP: 133/88  Pulse: 94   Neurologic Exam Mental Status: Awake and fully alert. Oriented to place and time. Recent and remote memory intact. Attention span, concentration and fund of knowledge appropriate. Mood and affect appropriate.  Cranial Nerves: Fundoscopic exam reveals sharp disc margins. Pupils equal, briskly reactive to light. Extraocular movements full without nystagmus. Visual fields full to confrontation. Hearing intact. Facial sensation intact. Face, tongue, palate moves normally and symmetrically.  Motor: Normal bulk and tone. Normal strength in all tested extremity muscles. Sensory.: intact to touch ,pinprick .position and vibratory sensation.  Coordination: Rapid alternating movements normal in all extremities. Finger-to-nose and heel-to-shin performed accurately bilaterally. Gait and Station: Arises from chair without difficulty. Stance is normal. Gait demonstrates normal stride length and balance . Able to heel, toe and tandem walk without difficulty.  Reflexes: 1+ and symmetric. Toes downgoing.  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 with successful coil embolization who is doing extremely well with no residual deficits.     PLAN:I had a long discussion with the patient about his episode of cerebral edema as well as AV fistula coil embolization answered questions I recommend he continue Eliquis for a total duration of at least 6-9 months and have follow-up angiogram with Dr. Sherlon Handing with repeat scheduled follow-up January 22, 2023.  Maintain hydration.  Return for follow-up in the future in 4 month Greater than 50% of time during this 35 minute visit was spent on counseling,explanation of diagnosis, planning of further management, discussion with patient and family and coordination of care Delia Heady, MD Note:  This document was prepared with digital dictation and possible smart phrase technology. Any transcriptional errors that result from this process are unintentional

## 2022-12-02 ENCOUNTER — Encounter: Payer: Self-pay | Admitting: Neurology

## 2022-12-15 ENCOUNTER — Other Ambulatory Visit: Payer: Self-pay | Admitting: Neurology

## 2023-01-20 ENCOUNTER — Other Ambulatory Visit (HOSPITAL_COMMUNITY): Payer: Self-pay | Admitting: Student

## 2023-01-20 DIAGNOSIS — Q282 Arteriovenous malformation of cerebral vessels: Secondary | ICD-10-CM

## 2023-01-21 NOTE — H&P (Signed)
Chief Complaint: Patient was seen in consultation today for diagnostic cerebral angiogram.   Referring Physician(s): Dr. Pearlean Brownie  Supervising Physician: Baldemar Lenis  Patient Status: [facility] - Out-pt  History of Present Illness: Donald Galloway is a 57 y.o. 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 Tommie Sams 07/22/22.   The patient tolerated the procedure well. He last followed up with Neurology 10/22/22 where he reported feeling well. He has been taking his Eliquis 5 mg twice daily. He presents today for a 6 month follow up with a diagnostic cerebral angiogram.   Past Medical History:  Diagnosis Date   Herpes simplex    Hyperlipidemia    Hypertension    Insomnia    Palpitations    evaluated by DR Eldridge Dace   Sleep apnea     Past Surgical History:  Procedure Laterality Date   IR ANGIO EXTERNAL CAROTID SEL EXT CAROTID BILAT MOD SED  07/20/2022   IR ANGIO INTRA EXTRACRAN SEL INTERNAL CAROTID BILAT MOD SED  07/20/2022   IR ANGIO INTRA EXTRACRAN SEL INTERNAL CAROTID UNI L MOD SED  07/22/2022   IR ANGIO VERTEBRAL SEL VERTEBRAL UNI L MOD SED  07/20/2022   IR ANGIO VERTEBRAL SEL VERTEBRAL UNI L MOD SED  07/22/2022   IR ANGIOGRAM FOLLOW UP STUDY  07/22/2022   IR ANGIOGRAM FOLLOW UP STUDY  07/22/2022   IR ANGIOGRAM FOLLOW UP STUDY  07/22/2022   IR ANGIOGRAM FOLLOW UP STUDY  07/22/2022   IR ANGIOGRAM FOLLOW UP STUDY  07/22/2022   IR NEURO EACH ADD'L AFTER BASIC UNI LEFT (MS)  08/10/2022   IR TRANSCATH/EMBOLIZ  07/22/2022   IR US GUIDE VASC ACCESS RIGHT  07/20/2022   IR US GUIDE VASC ACCESS RIGHT  07/22/2022   NOSE SURGERY     DR SHOEMAKER   RADIOLOGY WITH ANESTHESIA N/A 07/22/2022   Procedure: Embolization;  Surgeon: Baldemar Lenis, MD;  Location: [facility] OR;  Service: Radiology;  Laterality: N/A;    Allergies: Zolpidem tartrate  Medications: Prior to Admission  medications   Medication Sig Start Date End Date Taking? Authorizing Provider  amLODipine (NORVASC) 5 MG tablet Take 1 tablet (5 mg total) by mouth daily. 07/25/22   Lynnae January, NP  ELIQUIS 5 MG TABS tablet TAKE 1 TABLET BY MOUTH 2 TIMES DAILY 12/16/22   Micki Riley, MD  lisinopril (ZESTRIL) 20 MG tablet Take 1 tablet (20 mg total) by mouth daily. 07/25/22   Lynnae January, NP  loratadine (CLARITIN) 10 MG tablet Take 10 mg by mouth daily as needed for allergies.    [provider]  rosuvastatin (CRESTOR) 20 MG tablet Take 1 tablet (20 mg total) by mouth daily. 07/25/22   Lynnae January, NP  traZODone (DESYREL) 100 MG tablet Take 50 mg by mouth at bedtime. Pt stated he took 1/2 tablet the night of 07/15/22    [provider]     Family History  Problem Relation Age of Onset   Hyperlipidemia Mother    Hyperlipidemia Father    Hypertension Father     Social History   Socioeconomic History   Marital status: Single    Spouse name: Not on file   Number of children: Not on file   Years of education: Not on file   Highest education level: Not on file  Occupational History   Not on file  Tobacco Use  Smoking status: Never   Smokeless tobacco: Not on file  Substance and Sexual Activity   Alcohol use: Yes   Drug use: Never   Sexual activity: Not on file  Other Topics Concern   Not on file  Social History Narrative   Not on file   Social Determinants of Health   Financial Resource Strain: Not on file  Food Insecurity: No Food Insecurity (07/16/2022)   Hunger Vital Sign    Worried About Running Out of Food in the Last Year: Never true    Ran Out of Food in the Last Year: Never true  Transportation Needs: No Transportation Needs (07/16/2022)   PRAPARE - Administrator, Civil Service (Medical): No    Lack of Transportation (Non-Medical): No  Physical Activity: Not on file  Stress: Not on file  Social Connections: Not on file    Review of Systems:  A 12 point ROS discussed and pertinent positives are indicated in the HPI above.  All other systems are negative.  Review of Systems  Constitutional:  Negative for appetite change and fatigue.  Respiratory:  Negative for cough and shortness of breath.   Cardiovascular:  Negative for chest pain and leg swelling.  Gastrointestinal:  Negative for abdominal pain, diarrhea, nausea and vomiting.  Neurological:  Negative for dizziness and headaches.    Vital Signs: BP 139/83   Pulse 96   Temp 97.9 F (36.6 C) (Oral)   Resp 17   Ht 5\' 7"  (1.702 m)   Wt 175 lb (79.4 kg)   SpO2 100%   BMI 27.41 kg/m   Physical Exam Constitutional:      General: He is not in acute distress.    Appearance: He is not ill-appearing.  HENT:     Mouth/Throat:     Mouth: Mucous membranes are moist.     Pharynx: Oropharynx is clear.  Cardiovascular:     Pulses: Normal pulses.  Pulmonary:     Effort: Pulmonary effort is normal.  Abdominal:     Tenderness: There is no abdominal tenderness.  Skin:    General: Skin is warm and dry.  Neurological:     Mental Status: He is alert and oriented to person, place, and time.  Psychiatric:        Mood and Affect: Mood normal.        Behavior: Behavior normal.        Thought Content: Thought content normal.        Judgment: Judgment normal.     Imaging: No results found.  Labs:  CBC: Recent Labs    07/22/22 0332 07/22/22 1328 07/23/22 0450 07/24/22 0701 01/22/23 0842  WBC 6.6  --  7.7 8.1 8.2  HGB 14.3 13.3 11.8* 12.9* 15.4  HCT 40.8 39.0 34.2* 36.3* 44.8  PLT 238  --  213 224 265    COAGS: Recent Labs    07/16/22 1017 07/20/22 0453 07/22/22 0332 01/22/23 0842  INR 1.0 1.1 1.1 1.1  APTT 27  --   --   --     BMP: Recent Labs    07/16/22 1017 07/16/22 1034 07/22/22 0332 07/22/22 1328 07/23/22 0450  NA 137 138 138 139 139  K 4.0 4.1 3.3* 3.4* 3.9  CL 101 101 104  --  110  CO2 26  --  24  --  20*  GLUCOSE 108* 105* 91  --  147*   BUN 15 18 10   --  8  CALCIUM 9.6  --  8.8*  --  8.3*  CREATININE 1.07 1.00 0.89  --  0.78  GFRNONAA >60  --  >60  --  >60    LIVER FUNCTION TESTS: Recent Labs    07/16/22 1017 07/23/22 0450  BILITOT 1.0  --   AST 26  --   ALT 37  --   ALKPHOS 48  --   PROT 7.1  --   ALBUMIN 4.3 3.1*    TUMOR MARKERS: No results for input(s): "AFPTM", "CEA", "CA199", "CHROMGRNA" in the last 8760 hours.  Assessment and Plan:  Dural AV Fistula s/p embolization: Donald Galloway, 57 year old male, presents today to the [facility] Interventional Radiology department for an image-guided diagnostic cerebral angiogram.   Risks and benefits of this procedure were discussed with the patient including, but not limited to bleeding, infection, vascular injury or contrast induced renal failure.  This interventional procedure involves the use of X-rays and because of the nature of the planned procedure, it is possible that we will have prolonged use of X-ray fluoroscopy.  Potential radiation risks to you include (but are not limited to) the following: - A slightly elevated risk for cancer  several years later in life. This risk is typically less than 0.5% percent. This risk is low in comparison to the normal incidence of human cancer, which is 33% for women and 50% for men according to the [employer] Society. - Radiation induced injury can include skin redness, resembling a rash, tissue breakdown / ulcers and hair loss (which can be temporary or permanent).   The likelihood of either of these occurring depends on the difficulty of the procedure and whether you are sensitive to radiation due to previous procedures, disease, or genetic conditions.   IF your procedure requires a prolonged use of radiation, you will be notified and given written instructions for further action.  It is your responsibility to monitor the irradiated area for the 2 weeks following the procedure and to notify your physician if  you are concerned that you have suffered a radiation induced injury.    All of the patient's questions were answered, patient is agreeable to proceed. He has been NPO. He is a full code. His last dose of Eliquis was this morning.   Consent signed and in chart.  Thank you for this interesting consult.  I greatly enjoyed meeting Donald Galloway and look forward to participating in their care.  A copy of this report was sent to the requesting provider on this date.  Electronically Signed: Alwyn Ren, AGACNP-BC [phone] 01/22/2023, 9:19 AM   I spent a total of  30 Minutes   in face to face in clinical consultation, greater than 50% of which was counseling/coordinating care for diagnostic cerebral angiogram.

## 2023-01-22 ENCOUNTER — Other Ambulatory Visit (HOSPITAL_COMMUNITY): Payer: Self-pay | Admitting: Neuroradiology

## 2023-01-22 ENCOUNTER — Ambulatory Visit (HOSPITAL_COMMUNITY)
Admission: RE | Admit: 2023-01-22 | Discharge: 2023-01-22 | Disposition: A | Discharge status: Home / Self Care | Payer: 59 | Source: Ambulatory Visit | Attending: Neuroradiology | Admitting: Neuroradiology

## 2023-01-22 ENCOUNTER — Other Ambulatory Visit: Payer: Self-pay

## 2023-01-22 DIAGNOSIS — I609 Nontraumatic subarachnoid hemorrhage, unspecified: Secondary | ICD-10-CM

## 2023-01-22 DIAGNOSIS — Q282 Arteriovenous malformation of cerebral vessels: Secondary | ICD-10-CM

## 2023-01-22 DIAGNOSIS — I770 Arteriovenous fistula, acquired: Secondary | ICD-10-CM | POA: Insufficient documentation

## 2023-01-22 DIAGNOSIS — I872 Venous insufficiency (chronic) (peripheral): Secondary | ICD-10-CM | POA: Insufficient documentation

## 2023-01-22 DIAGNOSIS — I10 Essential (primary) hypertension: Secondary | ICD-10-CM | POA: Diagnosis not present

## 2023-01-22 DIAGNOSIS — Z7901 Long term (current) use of anticoagulants: Secondary | ICD-10-CM | POA: Diagnosis not present

## 2023-01-22 HISTORY — PX: IR ANGIO VERTEBRAL SEL VERTEBRAL BILAT MOD SED: IMG5369

## 2023-01-22 HISTORY — PX: IR ANGIO INTRA EXTRACRAN SEL INTERNAL CAROTID BILAT MOD SED: IMG5363

## 2023-01-22 HISTORY — PX: IR ANGIO EXTERNAL CAROTID SEL EXT CAROTID BILAT MOD SED: IMG5372

## 2023-01-22 HISTORY — PX: IR US GUIDE VASC ACCESS RIGHT: IMG2390

## 2023-01-22 LAB — BASIC METABOLIC PANEL
Anion gap: 12 (ref 5–15)
BUN: 16 mg/dL (ref 6–20)
CO2: 25 mmol/L (ref 22–32)
Calcium: 9.7 mg/dL (ref 8.9–10.3)
Chloride: 101 mmol/L (ref 98–111)
Creatinine, Ser: 1 mg/dL (ref 0.61–1.24)
GFR, Estimated: >60 mL/min (ref >60)
Glucose, Bld: 108 mg/dL — ABNORMAL HIGH (ref 70–99)
Potassium: 3.8 mmol/L (ref 3.5–5.1)
Sodium: 138 mmol/L (ref 135–145)

## 2023-01-22 LAB — CBC
HCT: 44.8 % (ref 39.0–52.0)
Hemoglobin: 15.4 g/dL (ref 13.0–17.0)
MCH: 30 pg (ref 26.0–34.0)
MCHC: 34.4 g/dL (ref 30.0–36.0)
MCV: 87.2 fL (ref 80.0–100.0)
Platelets: 265 10*3/uL (ref 150–400)
RBC: 5.14 MIL/uL (ref 4.22–5.81)
RDW: 11.8 % (ref 11.5–15.5)
WBC: 8.2 10*3/uL (ref 4.0–10.5)
nRBC: 0 % (ref 0.0–0.2)

## 2023-01-22 LAB — PROTIME-INR
INR: 1.1 (ref 0.8–1.2)
Prothrombin Time: 14.7 s (ref 11.4–15.2)

## 2023-01-22 MED ORDER — LIDOCAINE HCL 1 % IJ SOLN
INTRAMUSCULAR | Status: AC
  Filled 2023-01-22: qty 20

## 2023-01-22 MED ORDER — NITROGLYCERIN 1 MG/10 ML FOR IR/CATH LAB: 100.0000 ug | Freq: Once | INTRA_ARTERIAL | Status: DC

## 2023-01-22 MED ORDER — MIDAZOLAM HCL 2 MG/2ML IJ SOLN
INTRAMUSCULAR | Status: AC
  Filled 2023-01-22: qty 4

## 2023-01-22 MED ORDER — VERAPAMIL HCL 2.5 MG/ML IV SOLN: 5.0000 mg | Freq: Once | INTRAVENOUS | Status: DC

## 2023-01-22 MED ORDER — IOHEXOL 300 MG/ML  SOLN
150.0000 mL | Freq: Once | INTRAMUSCULAR | Status: AC | PRN
  Administered 2023-01-22: 85 mL via INTRA_ARTERIAL

## 2023-01-22 MED ORDER — HEPARIN SODIUM (PORCINE) 1000 UNIT/ML IJ SOLN
INTRAMUSCULAR | Status: AC
  Filled 2023-01-22: qty 10

## 2023-01-22 MED ORDER — VERAPAMIL HCL 2.5 MG/ML IV SOLN: INTRA_ARTERIAL | Status: AC | PRN

## 2023-01-22 MED ORDER — FENTANYL CITRATE (PF) 100 MCG/2ML IJ SOLN
INTRAMUSCULAR | Status: AC | PRN
Start: 2023-01-22 — End: 2023-01-22
  Administered 2023-01-22: 50 ug via INTRAVENOUS
  Administered 2023-01-22: 25 ug via INTRAVENOUS

## 2023-01-22 MED ORDER — MIDAZOLAM HCL 2 MG/2ML IJ SOLN
INTRAMUSCULAR | Status: AC | PRN
  Administered 2023-01-22: .5 mg via INTRAVENOUS
  Administered 2023-01-22: 1 mg via INTRAVENOUS
  Administered 2023-01-22: .5 mg via INTRAVENOUS
  Administered 2023-01-22: 1 mg via INTRAVENOUS

## 2023-01-22 MED ORDER — FENTANYL CITRATE (PF) 100 MCG/2ML IJ SOLN
INTRAMUSCULAR | Status: AC
  Filled 2023-01-22: qty 4

## 2023-01-22 MED ORDER — LIDOCAINE HCL 1 % IJ SOLN
20.0000 mL | Freq: Once | INTRAMUSCULAR | Status: AC
  Administered 2023-01-22: 1 mL via INTRADERMAL

## 2023-01-22 MED ORDER — VERAPAMIL HCL 2.5 MG/ML IV SOLN
INTRAVENOUS | Status: AC
  Filled 2023-01-22: qty 2

## 2023-01-22 MED ORDER — NITROGLYCERIN 1 MG/10 ML FOR IR/CATH LAB
INTRA_ARTERIAL | Status: AC
  Filled 2023-01-22: qty 10

## 2023-01-22 MED ORDER — SODIUM CHLORIDE 0.9 % IV SOLN: INTRAVENOUS | Status: DC

## 2023-01-22 NOTE — Discharge Instructions (Signed)

## 2023-01-25 ENCOUNTER — Telehealth (HOSPITAL_COMMUNITY): Payer: Self-pay

## 2023-01-25 NOTE — Telephone Encounter (Signed)
Called to schedule f/u with Dr. Quay Burow, no answer, left vm. AB

## 2023-01-26 ENCOUNTER — Other Ambulatory Visit (HOSPITAL_COMMUNITY): Payer: Self-pay | Admitting: Neuroradiology

## 2023-01-26 DIAGNOSIS — Q282 Arteriovenous malformation of cerebral vessels: Secondary | ICD-10-CM

## 2023-01-26 DIAGNOSIS — I609 Nontraumatic subarachnoid hemorrhage, unspecified: Secondary | ICD-10-CM

## 2023-02-03 ENCOUNTER — Ambulatory Visit (HOSPITAL_COMMUNITY)
Admission: RE | Admit: 2023-02-03 | Discharge: 2023-02-03 | Disposition: A | Discharge status: Home / Self Care | Payer: 59 | Source: Ambulatory Visit | Attending: Neuroradiology | Admitting: Neuroradiology

## 2023-02-03 DIAGNOSIS — I609 Nontraumatic subarachnoid hemorrhage, unspecified: Secondary | ICD-10-CM

## 2023-02-03 DIAGNOSIS — Q282 Arteriovenous malformation of cerebral vessels: Secondary | ICD-10-CM

## 2023-02-03 NOTE — Progress Notes (Signed)
Referring Physician(s): de Macedo Rodrigues,Railee Bonillas  Chief Complaint: The patient is seen in follow up today s/p diagnostic cerebral angiogram.  History of present illness:  Donald Galloway is a 57 year old male with past medical history significant for hyperlipidemia, hypertension and sleep apnea. He presented to the emergency department with fluctuating levels of altered mental status in April 2020. 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 He underwent endovascular embolization of the dural AV fistula on 07/22/2022. At the end of the procedure, no residual fistula was seen.  However, he underwent a follow-up diagnostic cerebral angiogram on 01/22/2023 that showed fistula recurrence.  Compared to pretreatment, the large left occipital artery feeders and left MMA petrosquamous feeder are no longer seen.  New tiny feeders from the occipital artery and left ascending pharyngeal are seen.  Cortical venous reflux remains, although it appears slightly slowed compared to pretreatment.   He denies any complication from recent angiogram or new neurological symptoms.    Past Medical History:  Diagnosis Date   Herpes simplex    Hyperlipidemia    Hypertension    Insomnia    Palpitations    evaluated by DR Eldridge Dace   Sleep apnea     Past Surgical History:  Procedure Laterality Date   IR ANGIO EXTERNAL CAROTID SEL EXT CAROTID BILAT MOD SED  07/20/2022   IR ANGIO EXTERNAL CAROTID SEL EXT CAROTID BILAT MOD SED  01/22/2023   IR ANGIO INTRA EXTRACRAN SEL INTERNAL CAROTID BILAT MOD SED  07/20/2022   IR ANGIO INTRA EXTRACRAN SEL INTERNAL CAROTID BILAT MOD  SED  01/22/2023   IR ANGIO INTRA EXTRACRAN SEL INTERNAL CAROTID UNI L MOD SED  07/22/2022   IR ANGIO VERTEBRAL SEL VERTEBRAL BILAT MOD SED  01/22/2023   IR ANGIO VERTEBRAL SEL VERTEBRAL UNI L MOD SED  07/20/2022   IR ANGIO VERTEBRAL SEL VERTEBRAL UNI L MOD SED  07/22/2022   IR ANGIOGRAM FOLLOW UP STUDY  07/22/2022   IR ANGIOGRAM FOLLOW UP STUDY  07/22/2022   IR ANGIOGRAM FOLLOW UP STUDY  07/22/2022   IR ANGIOGRAM FOLLOW UP STUDY  07/22/2022   IR ANGIOGRAM FOLLOW UP STUDY  07/22/2022   IR NEURO EACH ADD'L AFTER BASIC UNI LEFT (MS)  08/10/2022   IR TRANSCATH/EMBOLIZ  07/22/2022   IR US GUIDE VASC ACCESS RIGHT  07/20/2022   IR US GUIDE VASC ACCESS RIGHT  07/22/2022   IR US GUIDE VASC ACCESS RIGHT  01/22/2023   NOSE SURGERY     DR SHOEMAKER   RADIOLOGY WITH ANESTHESIA N/A 07/22/2022   Procedure: Embolization;  Surgeon: Baldemar Lenis, MD;  Location: [facility] OR;  Service: Radiology;  Laterality: N/A;    Allergies: Zolpidem tartrate  Medications: Prior to Admission medications   Medication Sig Start Date End Date Taking? Authorizing Provider  amLODipine (NORVASC) 5 MG tablet Take 1 tablet (5 mg total) by mouth daily. 07/25/22   Lynnae January, NP  ELIQUIS 5 MG TABS tablet TAKE 1 TABLET BY MOUTH 2 TIMES DAILY 12/16/22   Micki Riley, MD  lisinopril (ZESTRIL) 20 MG tablet Take 1 tablet (20 mg total) by mouth daily. 07/25/22   Lynnae January, NP  loratadine (CLARITIN) 10 MG tablet Take 10 mg by mouth daily as needed for allergies.    [provider]  rosuvastatin (CRESTOR) 20 MG tablet Take 1 tablet (20 mg total) by mouth daily. 07/25/22   Lynnae January, NP  traZODone (DESYREL) 100 MG tablet Take 50 mg by mouth at bedtime. Pt stated he took 1/2 tablet the night of 07/15/22    [provider]     Family History  Problem Relation Age of Onset   Hyperlipidemia Mother    Hyperlipidemia Father    Hypertension Father     Social History   Socioeconomic History   Marital  status: Single    Spouse name: Not on file   Number of children: Not on file   Years of education: Not on file   Highest education level: Not on file  Occupational History   Not on file  Tobacco Use   Smoking status: Never   Smokeless tobacco: Not on file  Substance and Sexual Activity   Alcohol use: Yes   Drug use: Never   Sexual activity: Not on file  Other Topics Concern   Not on file  Social History Narrative   Not on file   Social Determinants of Health   Financial Resource Strain: Not on file  Food Insecurity: No Food Insecurity (07/16/2022)   Hunger Vital Sign    Worried About Running Out of Food in the Last Year: Never true    Ran Out of Food in the Last Year: Never true  Transportation Needs: No Transportation Needs (07/16/2022)   PRAPARE - Administrator, Civil Service (Medical): No    Lack of Transportation (Non-Medical): No  Physical Activity: Not on file  Stress: Not on file  Social Connections: Not on file     Vital Signs: There were no vitals taken for this visit.  Physical Exam Constitutional:      Appearance: Normal appearance.  Neurological:     General: No focal deficit present.     Mental Status: He is alert and oriented to person, place, and time. Mental status is at baseline.  Psychiatric:        Mood and Affect: Mood normal.        Behavior: Behavior normal.        Thought Content: Thought content normal.        Judgment: Judgment normal.     Imaging: EXAM: ULTRASOUND-GUIDED VASCULAR ACCESS   DIAGNOSTIC CEREBRAL ANGIOGRAM   COMPARISON:  Cerebral angiogram July 20, 2022 and July 22, 2022.   MEDICATIONS: No antibiotics administered.   ANESTHESIA/SEDATION: Moderate (conscious) sedation was employed during this procedure. A total of Versed 3 mg and Fentanyl 75 mcg was administered intravenously by the radiology nurse.   Total intra-service moderate Sedation Time: 64 minutes. The patient's level of consciousness and  vital signs were monitored continuously by radiology nursing throughout the procedure under my direct supervision.   CONTRAST:  80 mL of Omnipaque 300 milligram/mL   FLUOROSCOPY: Radiation Exposure Index (as provided by the fluoroscopic device): 1,175 mGy Kerma   COMPLICATIONS: None immediate.   TECHNIQUE: Informed written consent was obtained from the patient after a thorough discussion of the procedural risks, benefits and alternatives. All questions were addressed. Maximal Sterile Barrier Technique was utilized including caps, mask, sterile gowns, sterile gloves, sterile drape, hand hygiene and skin antiseptic. A timeout was performed prior to the initiation of the procedure.   Using the modified Seldinger technique and a  micropuncture kit, access was gained to the distal right radial artery at the anatomical snuffbox and a 5 French sheath was placed. Real-time ultrasound guidance was utilized for vascular access including the acquisition of a permanent ultrasound image documenting patency of the accessed vessel. Slow intra arterial infusion of 5,000 IU heparin, 5 mg Verapamil and 200 mcg nitroglycerin diluted in patient's own blood was performed. No significant fluctuation in patient's blood pressure seen. Then, a right radial artery angiogram was obtained via sheath side port. Normal brachial artery branching pattern seen. No significant anatomical variation. The right radial artery caliber is adequate for vascular access.   Next, a 5 [place] Simmons 2 glide catheter was navigated over a 0.035" Terumo Glidewire into the right subclavian artery under fluoroscopic guidance. Using road map guidance, the catheter was placed into the right vertebral artery. Frontal and lateral angiograms of the head were obtained.   The catheter was then placed in the right common carotid artery. Frontal and lateral angiograms of the neck were obtained. Using biplane roadmap guidance, the  catheter was placed into the right internal carotid artery. Frontal and lateral angiograms of the head are obtained. The catheter was retracted into the right common carotid artery and then advanced into the right external carotid artery under biplane roadmap guidance. Frontal and lateral angiograms of the head were obtained.   Next, the catheter was placed in the left subclavian artery. Using roadmap guidance, the catheter was advanced into the left vertebral artery. Frontal and lateral angiograms of the head were obtained.   Subsequently, the catheter was placed into the left common carotid artery. Frontal and lateral angiograms of the neck were obtained. Using biplane roadmap guidance, the catheter was advanced into the left internal carotid artery. Frontal and lateral angiograms of the head were obtained. The catheter was retracted into the left common carotid artery and under biplane roadmap, the catheter was advanced into the left occipital artery. Frontal and lateral angiograms of the head were obtained. The catheter was then retracted into the left external carotid artery. Frontal and lateral angiograms of the head were obtained.   The catheter was subsequently withdrawn.   An inflatable band was placed and inflated over the right hand access site. The vascular sheath was withdrawn and the band was slowly deflated until brisk flow was noted through the arteriotomy site. At this point, the band was reinflated with additional 2 cc of air to obtain patent hemostasis.   FINDINGS: Right radial artery ultrasound and right radial artery angiogram: The caliber of the distal right radial artery is appropriate for angiogram access. The right radial artery and the right ulnar artery have normal course and caliber. No significant anatomical variants noted.   Right vertebral artery angiograms: The right vertebral artery, basilar artery, and bilateral posterior cerebral arteries  are unremarkable. Luminal caliber is smooth and tapering. No aneurysms or abnormally high-flow, early draining veins are seen. No regions of abnormal hypervascularity are noted. Persistent occlusion of the left transverse sinus with preserved patency of the contralateral right transverse and sigmoid sinuses.   Right CCA angiograms: Cervical angiograms show normal course and caliber of the visualized right common carotid and internal carotid arteries. There are no significant stenoses.   Right ICA angiograms: There is brisk vascular contrast filling of the right ACA and MCA vascular trees. Luminal caliber is smooth and tapering. No aneurysms or abnormally high-flow, early draining veins are seen. No regions of abnormal hypervascularity are noted. Chronic ectasia of the paroxysmal veins  is seen. Occlusion of the left transverse/sigmoid sinus with patency of the other major venous sinuses.   Right ECA angiograms: No early venous drainage was noted. The visualized branches of the right external carotid artery are unremarkable.   Left vertebral artery angiograms: The left vertebral artery, basilar artery, and bilateral posterior cerebral arteries are unremarkable. Luminal caliber is smooth and tapering. No aneurysms or abnormally high-flow, early draining veins are seen. No regions of abnormal hypervascularity are noted. The visualized dural sinuses are patent.   Left CCA angiograms: Cervical angiograms show normal course and caliber of the visualized left common carotid and internal carotid arteries. There are no significant stenoses.   Left ICA angiograms: There is brisk vascular contrast filling of the left ACA and MCA vascular trees. Luminal caliber is smooth and tapering. No aneurysms or abnormally high-flow, early draining veins are seen. No regions of abnormal hypervascularity are noted. The visualized dural sinuses are patent.   Left occipital artery and ECA angiograms:  Angiogram showed a recurrence of the left transverse/sigmoid sinus dural AV fistula with multiple tiny feeders from the left occipital artery, neuro meningeal trunk of the left ascending pharyngeal artery and posterior auricular artery. The main arterial supply is from a hypertrophied petrosal branch of the left middle meningeal artery. Interval occlusion of the major left occipital artery and petrousquamous MMA arterial feeders. Cortical venous reflux is seen, although with diminished caliber compared to angiogram prior to endovascular embolization.   PROCEDURE: No intervention performed.   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   PLAN: Findings and management options will be discussed with the patient in an outpatient appointment. Preliminary results communicated verbally at the end of the procedure.     Electronically Signed   By: Baldemar Lenis M.D.   On: 01/27/2023 11:16  Labs:  CBC: Recent Labs    07/22/22 0332 07/22/22 1328 07/23/22 0450 07/24/22 0701 01/22/23 0842  WBC 6.6  --  7.7 8.1 8.2  HGB 14.3 13.3 11.8* 12.9* 15.4  HCT 40.8 39.0 34.2* 36.3* 44.8  PLT 238  --  213 224 265    COAGS: Recent Labs    07/16/22 1017 07/20/22 0453 07/22/22 0332 01/22/23 0842  INR 1.0 1.1 1.1 1.1  APTT 27  --   --   --     BMP: Recent Labs    07/16/22 1017 07/16/22 1034 07/22/22 0332 07/22/22 1328 07/23/22 0450 01/22/23 0842  NA 137 138 138 139 139 138  K 4.0 4.1 3.3* 3.4* 3.9 3.8  CL 101 101 104  --  110 101  CO2 26  --  24  --  20* 25  GLUCOSE 108* 105* 91  --  147* 108*  BUN 15 18 10   --  8 16  CALCIUM 9.6  --  8.8*  --  8.3* 9.7  CREATININE 1.07 1.00 0.89  --  0.78 1.00  GFRNONAA >60  --  >60  --  >60 >60    LIVER FUNCTION TESTS: Recent Labs    07/16/22 1017 07/23/22 0450  BILITOT 1.0  --   AST 26  --   ALT 37  --    ALKPHOS 48  --   PROT 7.1  --   ALBUMIN 4.3 3.1*    Assessment:  I reviewed the last angiogram and prior MRI findings within Mr. Wieck and discussed relevant findings.  Given cortical venous reflux, I  believe he would benefit from treatment of the recurrent fistula.  He asked pertinent questions which were answered to his satisfaction.  He would like to proceed with scheduling of retreatment of his fistula under general anesthesia.   Signed: Baldemar Lenis, MD 02/03/2023, 1:46 PM    I spent a total of   30 minutes  in face to face in clinical consultation, greater than 50% of which was counseling/coordinating care for cerebral dural arteriovenous fistula.

## 2023-02-04 ENCOUNTER — Other Ambulatory Visit (HOSPITAL_COMMUNITY): Payer: Self-pay | Admitting: Neuroradiology

## 2023-02-04 DIAGNOSIS — I671 Cerebral aneurysm, nonruptured: Secondary | ICD-10-CM

## 2023-02-04 DIAGNOSIS — Q282 Arteriovenous malformation of cerebral vessels: Secondary | ICD-10-CM

## 2023-02-04 DIAGNOSIS — I609 Nontraumatic subarachnoid hemorrhage, unspecified: Secondary | ICD-10-CM

## 2023-02-15 ENCOUNTER — Other Ambulatory Visit: Payer: Self-pay | Admitting: Neurology

## 2023-02-15 NOTE — Telephone Encounter (Signed)
Prescription refill request for Eliquis received. Indication:dvt Last office visit:10/22/22 Scr:1.00 01/22/23 Age: 57 yr old male Weight:79.4kg

## 2023-03-03 ENCOUNTER — Other Ambulatory Visit: Payer: Self-pay | Admitting: Neurology

## 2023-03-04 ENCOUNTER — Telehealth (HOSPITAL_COMMUNITY): Payer: Self-pay

## 2023-03-04 ENCOUNTER — Encounter: Payer: Self-pay | Admitting: Neurology

## 2023-03-04 NOTE — Telephone Encounter (Signed)
Pt called to cancel intervention w/Dr. Quay Burow for 12/11. He would like to speak with a physician at [facility] regardinga a gamma knife procedure first. He will call back to let me know if he decides to move forward with this procedure. AB

## 2023-03-10 ENCOUNTER — Ambulatory Visit (HOSPITAL_COMMUNITY): Admission: RE | Admit: 2023-03-10 | Payer: 59 | Source: Home / Self Care | Admitting: Neuroradiology

## 2023-03-10 ENCOUNTER — Observation Stay (HOSPITAL_COMMUNITY): Payer: 59

## 2023-03-10 ENCOUNTER — Encounter (HOSPITAL_COMMUNITY): Admission: RE | Payer: Self-pay | Source: Home / Self Care

## 2023-03-10 SURGERY — IR WITH ANESTHESIA
Anesthesia: General

## 2023-03-26 ENCOUNTER — Other Ambulatory Visit (HOSPITAL_COMMUNITY): Payer: Self-pay

## 2023-05-18 ENCOUNTER — Encounter: Payer: Self-pay | Admitting: Neurology

## 2023-05-18 ENCOUNTER — Ambulatory Visit (INDEPENDENT_AMBULATORY_CARE_PROVIDER_SITE_OTHER): Payer: 59 | Admitting: Neurology

## 2023-05-18 VITALS — BP 140/81 | HR 80 | Ht 67.0 in | Wt 180.4 lb

## 2023-05-18 DIAGNOSIS — Z86718 Personal history of other venous thrombosis and embolism: Secondary | ICD-10-CM | POA: Diagnosis not present

## 2023-05-18 DIAGNOSIS — I671 Cerebral aneurysm, nonruptured: Secondary | ICD-10-CM

## 2023-05-18 DIAGNOSIS — Z8673 Personal history of transient ischemic attack (TIA), and cerebral infarction without residual deficits: Secondary | ICD-10-CM | POA: Diagnosis not present

## 2023-05-18 NOTE — Patient Instructions (Addendum)
 I had a long discussion with the patient regarding his recurrent dural AV fistula and history of cerebral venous sinus thrombosis and answered questions.  He had recent embolization of the AV fistula at [facility] last week.  I recommend he continue follow-up with Dr. Charlesetta Garibaldi at [facility] vascular neurosurgery for the same.  No need for any further antiplatelet or anticoagulation at this time.  Return for follow-up in the future to see me only if necessary and no scheduled appointment was made.

## 2023-05-18 NOTE — Progress Notes (Signed)
 [facility] [address] [place]. [place] [address] [phone]       OFFICE FOLLOW-UP NOTE  [name] Date of Birth:  12-12-1965 Medical Record Number:  [id number]   HPI: Initial visit 10/22/2022 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  History is obtained from the patient and review of electronic medical records.  I have personally reviewed pertinent available imaging films in PACS.  He has past medical history of hyperlipidemia, hypertension and sleep apnea.  He presented to the emergency room on 07/16/2022 for evaluation of altered mental status since the day prior.  He also complained of headache.  He had some trouble comprehending what others were saying at work and was noted as at times being zoned out and he did not hear the spoken words well.  He also had trouble with his speech output and speech was slurred at times and became gibberish.  He also noticed trouble recalling names of his coworkers and his deficits seem to wax and wane.  He also complained of dull aching frontal and left posterior lateral headache 4/10 in severity which did not improve with ibuprofen.  CT scan of the head showed asymmetric hypodensity in the left sigmoid/transverse sinus junction concerning for dural venous sinus thrombosis.  CT venogram showed a filling defect in the left transverse, sigmoid sinus and jugular bulb concerning for thrombosis.  CT angiogram of the head and neck showed no large vessel occlusion but did show dural AV fistula with small subarachnoid hemorrhage and evidence of dural venous sinus thrombosis on the left.  MRI confirmed abnormal signal in the left transverse and sigmoid sinuses ventricular valve compatible with dural venous sinus thrombosis.  MR angiogram confirmed AV fistula in from left occipital branches to the left transverse  sigmoid junction.  Catheter cerebral angiogram confirmed Cognard type IV dural AV fistula supplied by branches of left middle meningeal and occipital artery and draining into a small patent segment of the left transverse sigmoid sinus junction and the vein of Labbe.  Patient underwent elective embolization of the dural AV fistula on 07/22/2022 by neurointerventional radiology with Onyx liquid embolic agent via left middle meningeal artery and left occipital artery branches.  No residual AV fistula was noted at the end of the procedure.  And tolerated procedure well without any complication.  LDL cholesterol 99 mg percent.  Hemoglobin A1c was 5.2.  Urine drug screen was negative.  Hypercoagulable panel labs were all normal.  Patient was started on Eliquis 5 mg twice daily which she states she has been taking religiously without any bleeding.  He is also keeping himself well-hydrated.  He is returned back to work full-time and has no issues at work.  He has no complaints today.  He is scheduled to have follow-up cerebral catheter angiogram by Dr. Sherlon Handing scheduled for October 25,/2024 Update 05/18/2023 : He returns for follow-up after last visit 6 months ago.  He states he is doing well.  Denies significant headaches or any other neurological complaints.  He did see Dr. 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  He subsequently got a second opinion at [facility] and saw Dr. Charlesetta Garibaldi to discuss further treatment options including gamma knife he initially had a diagnostic angiogram in December 2024 and and subsequently had endovascular embolization of the left petrous  squamosal branch of middle meningeal artery for Borden type II dural AV fistula on 05/14/2023.  There was a small residual fistula remaining.  He plans to follow-up with Dr. Charlesetta Garibaldi repeat angiogram in a few months.  Patient has discontinued Eliquis since  03/25/2023 and has done well without any recurrent symptoms. ROS:   14 system review of systems is positive for headache, confusion, bruising and all other systems negative  PMH:  Past Medical History:  Diagnosis Date   Herpes simplex    Hyperlipidemia    Hypertension    Insomnia    Palpitations    evaluated by DR Eldridge Dace   Sleep apnea     Social History:  Social History   Socioeconomic History   Marital status: Single    Spouse name: Not on file   Number of children: Not on file   Years of education: Not on file   Highest education level: Not on file  Occupational History   Not on file  Tobacco Use   Smoking status: Never   Smokeless tobacco: Not on file  Vaping Use   Vaping status: Never Used  Substance and Sexual Activity   Alcohol use: Yes   Drug use: Never   Sexual activity: Not on file  Other Topics Concern   Not on file  Social History Narrative   Not on file   Social Drivers of Health   Financial Resource Strain: Not on file  Food Insecurity: No Food Insecurity (07/16/2022)   Hunger Vital Sign    Worried About Running Out of Food in the Last Year: Never true    Ran Out of Food in the Last Year: Never true  Transportation Needs: No Transportation Needs (07/16/2022)   PRAPARE - Administrator, Civil Service (Medical): No    Lack of Transportation (Non-Medical): No  Physical Activity: Not on file  Stress: Not on file  Social Connections: Not on file  Intimate Partner Violence: Not At Risk (07/16/2022)   Humiliation, Afraid, Rape, and Kick questionnaire    Fear of Current or Ex-Partner: No    Emotionally Abused: No    Physically Abused: No    Sexually Abused: No    Medications:   Current Outpatient Medications on File Prior to Visit  Medication Sig Dispense Refill   lisinopril (ZESTRIL) 20 MG tablet Take 1 tablet (20 mg total) by mouth daily. (Patient taking differently: Take 10 mg by mouth daily.) 30 tablet 0   loratadine (CLARITIN) 10  MG tablet Take 10 mg by mouth daily as needed for allergies.     rosuvastatin (CRESTOR) 20 MG tablet Take 1 tablet (20 mg total) by mouth daily. (Patient taking differently: Take 5 mg by mouth daily.) 30 tablet 0   traZODone (DESYREL) 100 MG tablet Take 50 mg by mouth at bedtime. Pt stated he took 1/2 tablet the night of 07/15/22     amLODipine (NORVASC) 5 MG tablet Take 1 tablet (5 mg total) by mouth daily. (Patient not taking: Reported on 05/18/2023) 30 tablet 0   apixaban (ELIQUIS) 5 MG TABS tablet TAKE 1 TABLET BY MOUTH 2 TIMES DAILY (Patient not taking: Reported on 05/18/2023) 180 tablet 0   No current facility-administered medications on file prior to visit.    Allergies:   Allergies  Allergen Reactions   Zolpidem Tartrate     Leaves him groggy in the AM    Physical Exam General: well developed, well nourished pleasant middle-aged Caucasian male, seated, in no evident  distress Head: head normocephalic and atraumatic.  Neck: supple with no carotid or supraclavicular bruits Cardiovascular: regular rate and rhythm, no murmurs Musculoskeletal: no deformity Skin:  no rash/petichiae Vascular:  Normal pulses all extremities Vitals:   05/18/23 1513  BP: (!) 140/81  Pulse: 80   Neurologic Exam Mental Status: Awake and fully alert. Oriented to place and time. Recent and remote memory intact. Attention span, concentration and fund of knowledge appropriate. Mood and affect appropriate.  Cranial Nerves: Fundoscopic exam r not done. Pupils equal, briskly reactive to light. Extraocular movements full without nystagmus. Visual fields full to confrontation. Hearing intact. Facial sensation intact. Face, tongue, palate moves normally and symmetrically.  Motor: Normal bulk and tone. Normal strength in all tested extremity muscles. Sensory.: intact to touch ,pinprick .position and vibratory sensation.  Coordination: Rapid alternating movements normal in all extremities. Finger-to-nose and  heel-to-shin performed accurately bilaterally. Gait and Station: Arises from chair without difficulty. Stance is normal. Gait demonstrates normal stride length and balance . Able to heel, toe and tandem walk without difficulty.  Reflexes: 1+ and symmetric. Toes downgoing.       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 with successful coil embolization who is doing extremely well with no residual deficits.     PLAN:I had a long discussion with the patient regarding his recurrent dural AV fistula and history of cerebral venous sinus thrombosis and answered questions.  He had recent embolization of the AV fistula at [facility] last week..  I recommend he continue follow-up with Dr. Charlesetta Garibaldi at [facility] vascular neurosurgery for the same.  No need for any further antiplatelet or anticoagulation at this time.  Return for follow-up in the future to see me only if necessary and no scheduled appointment was made. Greater than 50% of time during this 35 minute visit was spent on counseling,explanation of diagnosis of dural AV fistula, planning of further management, discussion with patient and family and coordination of care Delia Heady, MD Note: This document was prepared with digital dictation and possible smart phrase technology. Any transcriptional errors that result from this process are unintentional

## 2023-09-24 ENCOUNTER — Encounter (HOSPITAL_COMMUNITY): Payer: Self-pay | Admitting: Interventional Radiology
